# Patient Record
Sex: Male | Born: 1980 | Race: White | Hispanic: No | Smoking: Never smoker
Health system: Southern US, Community
[De-identification: ages and names within clinical notes are randomized; demographics above are authoritative.]

## PROBLEM LIST (undated history)

## (undated) DIAGNOSIS — F419 Anxiety disorder, unspecified: Secondary | ICD-10-CM

## (undated) DIAGNOSIS — F909 Attention-deficit hyperactivity disorder, unspecified type: Secondary | ICD-10-CM

## (undated) DIAGNOSIS — U071 COVID-19: Secondary | ICD-10-CM

## (undated) HISTORY — DX: Attention-deficit hyperactivity disorder, unspecified type: F90.9

## (undated) HISTORY — DX: COVID-19: U07.1

---

## 1999-12-18 ENCOUNTER — Encounter: Admission: RE | Admit: 1999-12-18 | Discharge: 1999-12-18 | Payer: Self-pay

## 2000-01-04 ENCOUNTER — Ambulatory Visit (HOSPITAL_BASED_OUTPATIENT_CLINIC_OR_DEPARTMENT_OTHER): Admission: RE | Admit: 2000-01-04 | Discharge: 2000-01-04 | Payer: Self-pay | Admitting: Oral Surgery

## 2007-07-07 ENCOUNTER — Emergency Department (HOSPITAL_COMMUNITY): Admission: EM | Admit: 2007-07-07 | Discharge: 2007-07-08 | Payer: Self-pay | Admitting: Emergency Medicine

## 2010-06-16 NOTE — Op Note (Signed)
[place]. [facility]  Patient:    Cory Bell, Cory Bell                   MRN: [id number] Proc. Date: 01/04/00 Adm. Date:  [id number] Attending:  Retia Passe                           Operative Report  PREOPERATIVE DIAGNOSIS:  Maxillary and mandibular impacted third molar teeth numbers 1, 16, 17 and 32; history of hypoglycemia reactions.  POSTOPERATIVE DIAGNOSIS:  Maxillary and mandibular impacted third molar teeth numbers 1, 16, 17 and 32, history of hypoglycemia reactions.  OPERATION PERFORMED:  Removal of impacted third molar teeth, numbers 1, 16, 17, and 32.  SURGEON:  Vania Rea. Warren Danes, D.D.S.  ASSISTANT:  Gaynell Face.  ANESTHESIA:  General via oral endotracheal intubation.  ESTIMATED BLOOD LOSS:  Less than 50 cc.  FLUID REPLACEMENT:  Approximately 1000 cc crystalloid solutions.  COMPLICATIONS:  None apparent.  INDICATIONS FOR PROCEDURE:  The patient is a 30 year old gentleman who was referred to my office for removal of his third molar teeth.  He presented with a severe history of chronic hypoglycemic attacks and even in the office before even being seated while still in the waiting room, suffered a severe hypoglycemic attack.  Due to his medical history, the patients third molar teeth were scheduled to be removed in an operating room setting where the glucose could be maintained properly.  DESCRIPTION OF PROCEDURE:  On January 04, 2000, Cory Bell was taken to the [facility] where he was placed on the operating room table in a supine position.  Following successful oral endotracheal intubation and general anesthesia, the patients face, neck and oral cavity were prepped and draped in the usual sterile operating room fashion.  The hypopharynx was suctioned free of fluids and secretions and a moistened 2 inch vaginal pack was placed as a throat pack.  Attention was then directed intraorally, where approximately 10  cc of 0.5% Xylocaine with 1:200,000 epinephrine were infiltrated in the right and left posterior superior alveolar neurovascular regions, the corresponding palatal soft tissues, and the right and left inferior alveolar neurovascular regions. A #15 Bard Parker blade was then used to create a full thickness mucoperiosteal incision about teeth numbers 1, 16, 17 and 32.  A full thickness mucoperiosteal flap was elevated laterally using a #9 Molt periosteal elevator.  The embedded teeth were then subluxated from the alveolus and removed from the maxillary arch using rongeurs forceps.  The mandibular third molar teeth were sectioned in their long axes using the Stryker rotary osteotome and a straight fissure burr.  The teeth were then elevated from the alveolus using an 11-A elevator and removed using rongeurs and forceps.  The surrounding dental follicular tissue was curetted and removed using a double-ended Molt curet.  The bony margins were smoothed and contoured with a small osseous file and surgical sites copiously irrigated with sterile saline irrigating solutions and suctioned.  Mucoperiosteal margins were approximated and sutured in an interrupted fashion using 4-0 chromic suture material on a PS-2 needle. The throat pack was removed and the hypopharynx suctioned free of fluids and secretions.  The patient was allowed to awaken from the anesthesia and taken to the recovery room where he tolerated the procedure well and without apparent complication. DD:  01/04/00 TD:  01/04/00 Job: [id number]

## 2010-10-26 LAB — DIFFERENTIAL
Basophils Relative: 0
Lymphocytes Relative: 11 — ABNORMAL LOW
Lymphs Abs: 1.4
Monocytes Absolute: 1.3 — ABNORMAL HIGH
Monocytes Relative: 10
Neutro Abs: 10.3 — ABNORMAL HIGH
Neutrophils Relative %: 79 — ABNORMAL HIGH

## 2010-10-26 LAB — CBC
Hemoglobin: 16.2
MCHC: 34
RBC: 5.32
WBC: 13.1 — ABNORMAL HIGH

## 2010-10-26 LAB — POCT I-STAT, CHEM 8
BUN: 15
Calcium, Ion: 1.13
Chloride: 104
Glucose, Bld: 119 — ABNORMAL HIGH
HCT: 50
Potassium: 3.3 — ABNORMAL LOW

## 2012-02-14 ENCOUNTER — Ambulatory Visit (INDEPENDENT_AMBULATORY_CARE_PROVIDER_SITE_OTHER): Payer: BC Managed Care – PPO | Admitting: Physician Assistant

## 2012-02-14 VITALS — BP 134/68 | HR 108 | Temp 99.4°F | Resp 18 | Ht 73.5 in | Wt 224.8 lb

## 2012-02-14 DIAGNOSIS — J101 Influenza due to other identified influenza virus with other respiratory manifestations: Secondary | ICD-10-CM

## 2012-02-14 DIAGNOSIS — R509 Fever, unspecified: Secondary | ICD-10-CM

## 2012-02-14 DIAGNOSIS — R05 Cough: Secondary | ICD-10-CM

## 2012-02-14 DIAGNOSIS — J111 Influenza due to unidentified influenza virus with other respiratory manifestations: Secondary | ICD-10-CM

## 2012-02-14 DIAGNOSIS — R059 Cough, unspecified: Secondary | ICD-10-CM

## 2012-02-14 LAB — POCT INFLUENZA A/B
Influenza A, POC: POSITIVE
Influenza B, POC: NEGATIVE

## 2012-02-14 MED ORDER — HYDROCOD POLST-CHLORPHEN POLST 10-8 MG/5ML PO LQCR: 5.0000 mL | Freq: Two times a day (BID) | ORAL | Status: DC | PRN

## 2012-02-14 MED ORDER — OSELTAMIVIR PHOSPHATE 75 MG PO CAPS: 75.0000 mg | ORAL_CAPSULE | Freq: Two times a day (BID) | ORAL | Status: DC

## 2012-02-14 NOTE — Progress Notes (Signed)
Patient ID: Cory Bell MRN: [id number], DOB: Oct 18, 1980, 32 y.o. Date of Encounter: 02/14/2012, 1:53 PM  Primary Physician: No primary provider on file.  Chief Complaint: fever, chills, nasal congestion  HPI: 32 y.o. year old male with presents with 2 day history of nasal congestion, fever, chills, and slight dry cough. Describes a burning chest pain associated with coughing.  No SOB, nausea, vomiting, or dizziness. Has tried Nyquil/Dayquil which has not helped much.  No known flu contacts, but does admit to several co-workers with similar illnesses.  Did not receive flu shot this year.    Patient is otherwise doing well without issues or complaints.  No past medical history on file.   Home Meds: Prior to Admission medications   Medication Sig Start Date End Date Taking? Authorizing Provider  Ascorbic Acid (VITAMIN C) 100 MG tablet Take 100 mg by mouth daily.   Yes Historical Provider, MD  Pseudoeph-Doxylamine-DM-APAP (NYQUIL PO) Take by mouth.   Yes Historical Provider, MD  Pseudoephedrine-APAP-DM (DAYQUIL PO) Take by mouth.   Yes Historical Provider, MD    Allergies:  Allergies  Allergen Reactions  . Septra (Sulfamethoxazole W-Trimethoprim) Rash    History   Social History  . Marital Status: Single    Spouse Name: N/A    Number of Children: N/A  . Years of Education: N/A   Occupational History  . Not on file.   Social History Main Topics  . Smoking status: Never Smoker   . Smokeless tobacco: Not on file  . Alcohol Use: 7.0 oz/week    14 drink(s) per week  . Drug Use: No  . Sexually Active: Not on file   Other Topics Concern  . Not on file   Social History Narrative  . No narrative on file     Review of Systems: Constitutional: positive for chills and fever. Negative for night sweats, weight changes, or fatigue  HEENT: negative for vision changes, hearing loss. Positive for congestion, rhinorrhea, ST. No epistaxis, or sinus pressure Cardiovascular:  negative for chest pain or palpitations Respiratory: positive for cough. negative for hemoptysis, wheezing, shortness of breath. Abdominal: negative for abdominal pain, nausea, vomiting, diarrhea, or constipation Dermatological: negative for rashes. Neurologic: negative for headache, dizziness, or syncope   Physical Exam: Blood pressure 134/68, pulse 108, temperature 99.4 F (37.4 C), temperature source Oral, resp. rate 18, height 6' 1.5" (1.867 m), weight 224 lb 12.8 oz (101.969 kg), SpO2 99.00%., Body mass index is 29.26 kg/(m^2). General: Well developed, well nourished, in no acute distress. Head: Normocephalic, atraumatic, eyes without discharge, sclera non-icteric, nares are without discharge. Bilateral auditory canals clear, TM's are without perforation, pearly grey and translucent with reflective cone of light bilaterally. Oral cavity moist, posterior pharynx without exudate, erythema, peritonsillar abscess, or post nasal drip.  Neck: Supple. No thyromegaly. Full ROM. No lymphadenopathy. Lungs: Clear bilaterally to auscultation without wheezes, rales, or rhonchi. Breathing is unlabored. Heart: RRR with S1 S2. No murmurs, rubs, or gallops appreciated. Msk:  Strength and tone normal for age. Extremities/Skin: Warm and dry. No clubbing or cyanosis. No edema.  Neuro: Alert and oriented X 3. Moves all extremities spontaneously. Gait is normal. CNII-XII grossly in tact. Psych:  Responds to questions appropriately with a normal affect.   Labs: Results for orders placed in visit on 02/14/12  POCT INFLUENZA A/B      Component Value Range   Influenza A, POC Positive     Influenza B, POC Negative  ASSESSMENT AND PLAN:  32 y.o. year old male with influenza Increase fluids and rest Ibuprofen and tylenol in alternating doses q4hours Start Tamiflu 75 mg bid x 5 days Tussionex q12hours prn cough Follow up if symptoms worsen or fail to improve.  Grier Mitts,  PA-C 02/14/2012 1:53 PM

## 2012-02-17 ENCOUNTER — Ambulatory Visit (INDEPENDENT_AMBULATORY_CARE_PROVIDER_SITE_OTHER): Payer: BC Managed Care – PPO | Admitting: Emergency Medicine

## 2012-02-17 ENCOUNTER — Encounter: Payer: Self-pay | Admitting: Family Medicine

## 2012-02-17 ENCOUNTER — Ambulatory Visit: Payer: BC Managed Care – PPO

## 2012-02-17 VITALS — BP 126/76 | HR 78 | Temp 98.7°F | Resp 16 | Ht 74.0 in | Wt 222.0 lb

## 2012-02-17 DIAGNOSIS — R059 Cough, unspecified: Secondary | ICD-10-CM

## 2012-02-17 DIAGNOSIS — R05 Cough: Secondary | ICD-10-CM

## 2012-02-17 DIAGNOSIS — J111 Influenza due to unidentified influenza virus with other respiratory manifestations: Secondary | ICD-10-CM

## 2012-02-17 DIAGNOSIS — R0602 Shortness of breath: Secondary | ICD-10-CM

## 2012-02-17 DIAGNOSIS — J40 Bronchitis, not specified as acute or chronic: Secondary | ICD-10-CM

## 2012-02-17 DIAGNOSIS — R042 Hemoptysis: Secondary | ICD-10-CM

## 2012-02-17 LAB — POCT CBC
HCT, POC: 49.5 % (ref 43.5–53.7)
Hemoglobin: 16.1 g/dL (ref 14.1–18.1)
Lymph, poc: 1.8 (ref 0.6–3.4)
MCH, POC: 30 pg (ref 27–31.2)
MCHC: 32.5 g/dL (ref 31.8–35.4)
MPV: 9.7 fL (ref 0–99.8)
POC Granulocyte: 4.5 (ref 2–6.9)
POC LYMPH PERCENT: 25.9 %L (ref 10–50)
POC MID %: 10 %M (ref 0–12)
RDW, POC: 12.2 %
WBC: 7 10*3/uL (ref 4.6–10.2)

## 2012-02-17 MED ORDER — DOXYCYCLINE HYCLATE 100 MG PO CAPS: 100.0000 mg | ORAL_CAPSULE | Freq: Two times a day (BID) | ORAL | Status: DC

## 2012-02-17 NOTE — Progress Notes (Signed)
  Subjective:    Patient ID: Cory Bell, male    DOB: 02/06/1980, 32 y.o.   MRN: [id number]  HPI 32 year old male presents with chest congestion, productive cough: coughing up brown "chunks"; fever, fatigue, wheezing at night  Diagnosed with the flu on 02/14/12.   Never smoker  Review of Systems     Objective:   Physical Exam HEENT exam. TMs are clear. Nose is congested. Posterior pharynx is clear. The neck is supple. Chest exam reveals a few rhonchi but no rales there are no areas of dullness.  [facility] reading (PRIMARY) by  Dr. Cleta Alberts there are mild increased interstitial markings present bilaterally.  Results for orders placed in visit on 02/17/12  POCT CBC      Component Value Range   WBC 7.0  4.6 - 10.2 K/uL   Lymph, poc 1.8  0.6 - 3.4   POC LYMPH PERCENT 25.9  10 - 50 %L   MID (cbc) 0.7  0 - 0.9   POC MID % 10.0  0 - 12 %M   POC Granulocyte 4.5  2 - 6.9   Granulocyte percent 64.1  37 - 80 %G   RBC 5.37  4.69 - 6.13 M/uL   Hemoglobin 16.1  14.1 - 18.1 g/dL   HCT, POC 40.9  81.1 - 53.7 %   MCV 92.2  80 - 97 fL   MCH, POC 30.0  27 - 31.2 pg   MCHC 32.5  31.8 - 35.4 g/dL   RDW, POC 91.4     Platelet Count, POC 211  142 - 424 K/uL   MPV 9.7  0 - 99.8 fL        Assessment & Plan:  White count is normal there are increased interstitial markings on chest x-ray will cover for secondary bronchitis infection

## 2013-03-24 ENCOUNTER — Encounter (HOSPITAL_COMMUNITY): Payer: Self-pay | Admitting: Emergency Medicine

## 2013-03-24 ENCOUNTER — Emergency Department (HOSPITAL_COMMUNITY): Payer: BC Managed Care – PPO

## 2013-03-24 ENCOUNTER — Emergency Department (HOSPITAL_COMMUNITY)
Admission: EM | Admit: 2013-03-24 | Discharge: 2013-03-24 | Disposition: A | Discharge status: Home / Self Care | Payer: BC Managed Care – PPO | Attending: Emergency Medicine | Admitting: Emergency Medicine

## 2013-03-24 DIAGNOSIS — Z79899 Other long term (current) drug therapy: Secondary | ICD-10-CM | POA: Insufficient documentation

## 2013-03-24 DIAGNOSIS — R5381 Other malaise: Secondary | ICD-10-CM | POA: Insufficient documentation

## 2013-03-24 DIAGNOSIS — Z8659 Personal history of other mental and behavioral disorders: Secondary | ICD-10-CM | POA: Insufficient documentation

## 2013-03-24 DIAGNOSIS — R079 Chest pain, unspecified: Secondary | ICD-10-CM

## 2013-03-24 DIAGNOSIS — R5383 Other fatigue: Secondary | ICD-10-CM

## 2013-03-24 DIAGNOSIS — R0602 Shortness of breath: Secondary | ICD-10-CM | POA: Insufficient documentation

## 2013-03-24 DIAGNOSIS — R002 Palpitations: Secondary | ICD-10-CM | POA: Insufficient documentation

## 2013-03-24 DIAGNOSIS — R109 Unspecified abdominal pain: Secondary | ICD-10-CM

## 2013-03-24 HISTORY — DX: Anxiety disorder, unspecified: F41.9

## 2013-03-24 LAB — I-STAT TROPONIN, ED
TROPONIN I, POC: 0.01 ng/mL (ref 0.00–0.08)
TROPONIN I, POC: 0 ng/mL (ref 0.00–0.08)

## 2013-03-24 LAB — CBC
HCT: 46.3 % (ref 39.0–52.0)
HEMOGLOBIN: 16.4 g/dL (ref 13.0–17.0)
MCH: 31.3 pg (ref 26.0–34.0)
MCHC: 35.4 g/dL (ref 30.0–36.0)
MCV: 88.4 fL (ref 78.0–100.0)
Platelets: 227 10*3/uL (ref 150–400)
RBC: 5.24 MIL/uL (ref 4.22–5.81)
RDW: 12.5 % (ref 11.5–15.5)
WBC: 6.5 10*3/uL (ref 4.0–10.5)

## 2013-03-24 LAB — BASIC METABOLIC PANEL
BUN: 11 mg/dL (ref 6–23)
CHLORIDE: 102 meq/L (ref 96–112)
CO2: 25 meq/L (ref 19–32)
Calcium: 9.6 mg/dL (ref 8.4–10.5)
Creatinine, Ser: 1.07 mg/dL (ref 0.50–1.35)
GFR calc Af Amer: >90 mL/min (ref >90)
GFR calc non Af Amer: 89 mL/min — ABNORMAL LOW (ref >90)
GLUCOSE: 107 mg/dL — AB (ref 70–99)
POTASSIUM: 3.9 meq/L (ref 3.7–5.3)
SODIUM: 143 meq/L (ref 137–147)

## 2013-03-24 LAB — HEPATIC FUNCTION PANEL
ALT: 65 U/L — ABNORMAL HIGH (ref 0–53)
AST: 32 U/L (ref 0–37)
Albumin: 4.4 g/dL (ref 3.5–5.2)
Alkaline Phosphatase: 58 U/L (ref 39–117)
TOTAL PROTEIN: 7.7 g/dL (ref 6.0–8.3)
Total Bilirubin: 0.6 mg/dL (ref 0.3–1.2)

## 2013-03-24 LAB — LIPASE, BLOOD: LIPASE: 33 U/L (ref 11–59)

## 2013-03-24 LAB — CBG MONITORING, ED: Glucose-Capillary: 109 mg/dL — ABNORMAL HIGH (ref 70–99)

## 2013-03-24 LAB — PRO B NATRIURETIC PEPTIDE: Pro B Natriuretic peptide (BNP): 31.1 pg/mL (ref 0–125)

## 2013-03-24 MED ORDER — HEPARIN (PORCINE) IN NACL 100-0.45 UNIT/ML-% IJ SOLN
1000.0000 [IU]/h | INTRAMUSCULAR | Status: DC
  Filled 2013-03-24: qty 250

## 2013-03-24 MED ORDER — GI COCKTAIL ~~LOC~~
30.0000 mL | Freq: Once | ORAL | Status: AC
  Administered 2013-03-24: 30 mL via ORAL
  Filled 2013-03-24: qty 30

## 2013-03-24 MED ORDER — OMEPRAZOLE 20 MG PO CPDR: 20.0000 mg | DELAYED_RELEASE_CAPSULE | Freq: Every day | ORAL | Status: DC

## 2013-03-24 MED ORDER — IBUPROFEN 200 MG PO TABS
600.0000 mg | ORAL_TABLET | Freq: Once | ORAL | Status: AC
  Administered 2013-03-24: 600 mg via ORAL
  Filled 2013-03-24: qty 3

## 2013-03-24 NOTE — ED Provider Notes (Signed)
CSN: [id number]     Arrival date & time 03/24/13  1611 History   First MD Initiated Contact with Patient 03/24/13 1955     Chief Complaint  Patient presents with  . Chest Pain  . Shortness of Breath  . Palpitations  . Fatigue     (Consider location/radiation/quality/duration/timing/severity/associated sxs/prior Treatment) HPI  This a 58 are old male with history of anxiety who presents with chest pain, shortness of breath, palpitations, and abdominal pain. Patient reports intermittent chest pain over the last 2 days. He states that it comes and goes. It is not exertional. It does get worse with movement. He is not having any pain right now. Last episode was approximately 3 hours ago. He reports associated shortness of breath. Patient also reports that he on occasion gets "bloating" after he eats.  He denies any abdominal pain right now.  He denies any history of hypertension, hypercholesterolemia, smoking. He denies any early family of heart disease. He denies any recent hospitalization, long travel, history of blood clots. He denies any unilateral leg swelling. He denies any cough, fevers.  Past Medical History  Diagnosis Date  . Anxiety    History reviewed. No pertinent past surgical history. No family history on file. History  Substance Use Topics  . Smoking status: Never Smoker   . Smokeless tobacco: Not on file  . Alcohol Use: 7.0 oz/week    14 drink(s) per week    Review of Systems  Constitutional: Negative.  Negative for fever.  Respiratory: Positive for chest tightness and shortness of breath. Negative for cough.   Cardiovascular: Negative.  Negative for chest pain.  Gastrointestinal: Positive for abdominal pain. Negative for nausea, vomiting and diarrhea.  Genitourinary: Negative.  Negative for dysuria.  Musculoskeletal: Negative for back pain.  Skin: Negative for rash.  Neurological: Negative for headaches.  All other systems reviewed and are  negative.      Allergies  Septra  Home Medications   Current Outpatient Rx  Name  Route  Sig  Dispense  Refill  . omeprazole (PRILOSEC) 20 MG capsule   Oral   Take 1 capsule (20 mg total) by mouth daily.   30 capsule   0    BP 128/72  Pulse 67  Temp(Src) 97.6 F (36.4 C) (Oral)  Resp 17  Wt 236 lb (107.049 kg)  SpO2 95% Physical Exam  Nursing note and vitals reviewed. Constitutional: He is oriented to person, place, and time. He appears well-developed and well-nourished.  HENT:  Head: Normocephalic and atraumatic.  Eyes: Pupils are equal, round, and reactive to light.  Neck: Neck supple.  Cardiovascular: Normal rate, regular rhythm and normal heart sounds.   No murmur heard. Pulmonary/Chest: Effort normal and breath sounds normal. No respiratory distress. He has no wheezes.  Abdominal: Soft. Bowel sounds are normal. There is no tenderness. There is no rebound.  Musculoskeletal: He exhibits no edema.  Lymphadenopathy:    He has no cervical adenopathy.  Neurological: He is alert and oriented to person, place, and time.  Skin: Skin is warm and dry.  Psychiatric: He has a normal mood and affect.    ED Course  Procedures (including critical care time) Labs Review Labs Reviewed  BASIC METABOLIC PANEL - Abnormal; Notable for the following:    Glucose, Bld 107 (*)    GFR calc non Af Amer 89 (*)    All other components within normal limits  HEPATIC FUNCTION PANEL - Abnormal; Notable for the following:  ALT 65 (*)    All other components within normal limits  CBG MONITORING, ED - Abnormal; Notable for the following:    Glucose-Capillary 109 (*)    All other components within normal limits  CBC  PRO B NATRIURETIC PEPTIDE  LIPASE, BLOOD  I-STAT TROPOININ, ED  I-STAT TROPOININ, ED   Imaging Review Dg Chest 2 View  03/24/2013   CLINICAL DATA:  Chest tightness, shortness of breath, palpitations and fatigue.  EXAM: CHEST  2 VIEW  COMPARISON:  02/17/2012.   FINDINGS: The heart remains normal in size. The lungs remain clear with normal vascularity. Normal appearing bones.  IMPRESSION: Normal examination, unchanged.   Electronically Signed   By: Gordan PaymentSteve  Reid M.D.   On: 03/24/2013 18:06    EKG Interpretation    Date/Time:  Tuesday March 24 2013 16:14:05 EST Ventricular Rate:  97 PR Interval:  126 QRS Duration: 86 QT Interval:  324 QTC Calculation: 411 R Axis:   46 Text Interpretation:  Normal sinus rhythm Normal ECG Confirmed by Mileah Hemmer  MD, Nayomi Tabron ([id number]) on 03/24/2013 7:55:56 PM            MDM   Final diagnoses:  Chest pain  Abdominal pain   Patient presents with multiple complaints. He is nontoxic-appearing on exam and vital signs are within normal limits. He is low risk for ACS. EKG is within normal limits. Delta troponin is negative and chest x-ray shows no evidence of pneumothorax or pneumonia. Patient PERC negative.  Has not had recurrent pain here.  I have obtained LFTs and lipase to investigate patient's occasional abdominal pain. Suspect occurred given that happens after eating. Patient was given GI cocktail. Workup is reassuring. He does not have a primary care physician and will be referred. I have discussed with him that a primary doctor can refer him for further GI evaluation if he continues to have pain. He may need a gallbladder evaluation. Patient stated understanding.  After history, exam, and medical workup I feel the patient has been appropriately medically screened and is safe for discharge home. Pertinent diagnoses were discussed with the patient. Patient was given return precautions.     Shon Batonourtney F Rea Reser, MD 03/26/13 [phone]

## 2013-03-24 NOTE — ED Notes (Signed)
Pt is here with chest pain, sob, palpitations, and feels like abdomen is swollen.  Pt states when he lays down over the last 2 days he feels like he is sufficating.  Pt states he suffers from anxiety but these symptoms are different

## 2013-03-24 NOTE — Discharge Instructions (Signed)
Chest Pain (Nonspecific) °It is often hard to give a specific diagnosis for the cause of chest pain. There is always a chance that your pain could be related to something serious, such as a heart attack or a blood clot in the lungs. You need to follow up with your caregiver for further evaluation. °CAUSES  °· Heartburn. °· Pneumonia or bronchitis. °· Anxiety or stress. °· Inflammation around your heart (pericarditis) or lung (pleuritis or pleurisy). °· A blood clot in the lung. °· A collapsed lung (pneumothorax). It can develop suddenly on its own (spontaneous pneumothorax) or from injury (trauma) to the chest. °· Shingles infection (herpes zoster virus). °The chest wall is composed of bones, muscles, and cartilage. Any of these can be the source of the pain. °· The bones can be bruised by injury. °· The muscles or cartilage can be strained by coughing or overwork. °· The cartilage can be affected by inflammation and become sore (costochondritis). °DIAGNOSIS  °Lab tests or other studies, such as X-rays, electrocardiography, stress testing, or cardiac imaging, may be needed to find the cause of your pain.  °TREATMENT  °· Treatment depends on what may be causing your chest pain. Treatment may include: °· Acid blockers for heartburn. °· Anti-inflammatory medicine. °· Pain medicine for inflammatory conditions. °· Antibiotics if an infection is present. °· You may be advised to change lifestyle habits. This includes stopping smoking and avoiding alcohol, caffeine, and chocolate. °· You may be advised to keep your head raised (elevated) when sleeping. This reduces the chance of acid going backward from your stomach into your esophagus. °· Most of the time, nonspecific chest pain will improve within 2 to 3 days with rest and mild pain medicine. °HOME CARE INSTRUCTIONS  °· If antibiotics were prescribed, take your antibiotics as directed. Finish them even if you start to feel better. °· For the next few days, avoid physical  activities that bring on chest pain. Continue physical activities as directed. °· Do not smoke. °· Avoid drinking alcohol. °· Only take over-the-counter or prescription medicine for pain, discomfort, or fever as directed by your caregiver. °· Follow your caregiver's suggestions for further testing if your chest pain does not go away. °· Keep any follow-up appointments you made. If you do not go to an appointment, you could develop lasting (chronic) problems with pain. If there is any problem keeping an appointment, you must call to reschedule. °SEEK MEDICAL CARE IF:  °· You think you are having problems from the medicine you are taking. Read your medicine instructions carefully. °· Your chest pain does not go away, even after treatment. °· You develop a rash with blisters on your chest. °SEEK IMMEDIATE MEDICAL CARE IF:  °· You have increased chest pain or pain that spreads to your arm, neck, jaw, back, or abdomen. °· You develop shortness of breath, an increasing cough, or you are coughing up blood. °· You have severe back or abdominal pain, feel nauseous, or vomit. °· You develop severe weakness, fainting, or chills. °· You have a fever. °THIS IS AN EMERGENCY. Do not wait to see if the pain will go away. Get medical help at once. Call your local emergency services (911 in [place] Do not drive yourself to the hospital. °MAKE SURE YOU:  °· Understand these instructions. °· Will watch your condition. °· Will get help right away if you are not doing well or get worse. °Document Released: 10/25/2004 Document Revised: 04/09/2011 Document Reviewed: 08/21/2007 °ExitCare® Patient Information ©2014 [employer]   LL[employer]. °Gastroesophageal Reflux Disease, Adult °Gastroesophageal reflux disease (GERD) happens when acid from your stomach flows up into the esophagus. When acid comes in contact with the esophagus, the acid causes soreness (inflammation) in the esophagus. Over time, GERD may create small holes (ulcers) in the lining of the  esophagus. °CAUSES  °· Increased body weight. This puts pressure on the stomach, making acid rise from the stomach into the esophagus. °· Smoking. This increases acid production in the stomach. °· Drinking alcohol. This causes decreased pressure in the lower esophageal sphincter (valve or ring of muscle between the esophagus and stomach), allowing acid from the stomach into the esophagus. °· Late evening meals and a full stomach. This increases pressure and acid production in the stomach. °· A malformed lower esophageal sphincter. °Sometimes, no cause is found. °SYMPTOMS  °· Burning pain in the lower part of the mid-chest behind the breastbone and in the mid-stomach area. This may occur twice a week or more often. °· Trouble swallowing. °· Sore throat. °· Dry cough. °· Asthma-like symptoms including chest tightness, shortness of breath, or wheezing. °DIAGNOSIS  °Your caregiver may be able to diagnose GERD based on your symptoms. In some cases, X-rays and other tests may be done to check for complications or to check the condition of your stomach and esophagus. °TREATMENT  °Your caregiver may recommend over-the-counter or prescription medicines to help decrease acid production. Ask your caregiver before starting or adding any new medicines.  °HOME CARE INSTRUCTIONS  °· Change the factors that you can control. Ask your caregiver for guidance concerning weight loss, quitting smoking, and alcohol consumption. °· Avoid foods and drinks that make your symptoms worse, such as: °· Caffeine or alcoholic drinks. °· Chocolate. °· Peppermint or mint flavorings. °· Garlic and onions. °· Spicy foods. °· Citrus fruits, such as oranges, lemons, or limes. °· Tomato-based foods such as sauce, chili, salsa, and pizza. °· Fried and fatty foods. °· Avoid lying down for the 3 hours prior to your bedtime or prior to taking a nap. °· Eat small, frequent meals instead of large meals. °· Wear loose-fitting clothing. Do not wear anything  tight around your waist that causes pressure on your stomach. °· Raise the head of your bed 6 to 8 inches with wood blocks to help you sleep. Extra pillows will not help. °· Only take over-the-counter or prescription medicines for pain, discomfort, or fever as directed by your caregiver. °· Do not take aspirin, ibuprofen, or other nonsteroidal anti-inflammatory drugs (NSAIDs). °SEEK IMMEDIATE MEDICAL CARE IF:  °· You have pain in your arms, neck, jaw, teeth, or back. °· Your pain increases or changes in intensity or duration. °· You develop nausea, vomiting, or sweating (diaphoresis). °· You develop shortness of breath, or you faint. °· Your vomit is green, yellow, black, or looks like coffee grounds or blood. °· Your stool is red, bloody, or black. °These symptoms could be signs of other problems, such as heart disease, gastric bleeding, or esophageal bleeding. °MAKE SURE YOU:  °· Understand these instructions. °· Will watch your condition. °· Will get help right away if you are not doing well or get worse. °Document Released: 10/25/2004 Document Revised: 04/09/2011 Document Reviewed: 08/04/2010 °ExitCare® Patient Information ©2014 [employer]. ° °

## 2013-03-24 NOTE — ED Notes (Signed)
Dr. Horton at bedside. 

## 2014-09-28 ENCOUNTER — Ambulatory Visit (INDEPENDENT_AMBULATORY_CARE_PROVIDER_SITE_OTHER): Payer: Worker's Compensation | Admitting: Family Medicine

## 2014-09-28 ENCOUNTER — Ambulatory Visit: Payer: Worker's Compensation

## 2014-09-28 VITALS — BP 124/80 | HR 71 | Temp 98.7°F | Resp 17 | Ht 72.0 in | Wt 223.0 lb

## 2014-09-28 DIAGNOSIS — S6992XA Unspecified injury of left wrist, hand and finger(s), initial encounter: Secondary | ICD-10-CM

## 2014-09-28 DIAGNOSIS — S61012S Laceration without foreign body of left thumb without damage to nail, sequela: Secondary | ICD-10-CM | POA: Diagnosis not present

## 2014-09-28 DIAGNOSIS — Z23 Encounter for immunization: Secondary | ICD-10-CM

## 2014-09-28 DIAGNOSIS — R55 Syncope and collapse: Secondary | ICD-10-CM | POA: Diagnosis not present

## 2014-09-28 MED ORDER — NAPROXEN 500 MG PO TABS: 500.0000 mg | ORAL_TABLET | Freq: Two times a day (BID) | ORAL | Status: DC

## 2014-09-28 MED ORDER — AMOXICILLIN-POT CLAVULANATE 875-125 MG PO TABS: 1.0000 | ORAL_TABLET | Freq: Two times a day (BID) | ORAL | Status: DC

## 2014-09-28 NOTE — Progress Notes (Signed)
Cory Bell [birth year] 34 y.o.   Chief Complaint  Patient presents with  . Hand Injury    cut to hand     Date of Injury: 09/28/2014   Presents for evaluation of work-related complaint.  History of Present Illness: Patient sustained a laceration the base of the left thumb while working to install a stove today and reports his left hand got caught in the door.  States while trying to free his hand a metal pin in the door became stuck in his hand.  He complains of tenderness of the first left metacarpal just proximal to the laceration. Denies changes in sensation distal to the injury.  Denies a change in function of the hand.     Reports that after the incident he passed out at the sight of his own blood and says that this has happened before.  Denies a history of seizures and feels well now.     Review of Systems  Constitutional: Negative for fever.  Cardiovascular: Negative for chest pain.  Musculoskeletal: Positive for joint pain. Negative for neck pain.  Skin: Negative for rash.  Neurological: Negative for dizziness.      Allergies  Allergen Reactions  . Septra [Sulfamethoxazole-Trimethoprim] Rash     Current medications reviewed and updated. Past medical history, family history, social history have been reviewed and updated.  Filed Vitals:   09/28/14 1326  BP: 124/80  Pulse: 71  Temp: 98.7 F (37.1 C)  Resp: 17     Physical Exam  Constitutional: He is oriented to person, place, and time and well-developed, well-nourished, and in no distress. No distress.  Cardiovascular: Normal rate.   Pulmonary/Chest: Effort normal.  Abdominal: He exhibits no distension.  Musculoskeletal:       Right hand: Normal.       Left hand: He exhibits tenderness and bony tenderness. He exhibits normal range of motion and normal capillary refill. Normal sensation noted. Normal strength noted.       Hands: Neurological: He is alert and oriented to person, place, and time. He  has normal sensation. He displays no weakness, facial symmetry, normal stance and normal speech. No cranial nerve deficit. He exhibits normal muscle tone. He has a normal Cerebellar Exam, a normal Romberg Test and a normal Tandem Gait Test. He shows no pronator drift. Gait normal. Coordination and gait normal. GCS score is 15.  Skin: Skin is warm and dry. He is not diaphoretic.  Psychiatric: Affect normal.   [facility] reading (PRIMARY) by  Dr. Alwyn Ren: Negative.    Procedure: Risk and benefits discussed and verbal consent obtained. The patient was anesthetized using 1 cc of 2% lidocaine. The wound was scrubbed with soap and water. Sterile prep and drape. The wound was closed with 4-0 Prolene.  The wound was then cleaned with water and a bandage was applied. Patient instructed to come back for suture removal in 10 days.    Assessment and Plan:  [name] was seen today for hand injury.  Diagnoses and all orders for this visit:  Hand injury, left, initial encounter: Will treat pain with Naprosyn.  Given this injury is so close the base of the 1st will cover with broad spectrum antibiotic.  He is to follow up in ten days for suture removal.   -     DG Hand Complete Left; Future  Vasovagal syncope: Patient with intact neurological exam.

## 2014-09-28 NOTE — Progress Notes (Deleted)
   09/28/2014 at 1:43 PM  Cory Bell / DOB: 1980-05-10 / MRN: [id number]  The patient  does not have a problem list on file.  SUBJECTIVE  Cory Bell is a 34 y.o. {DESC; WELL/ILL:30681} appearing male presenting for the chief complaint of ***.     He  has a past medical history of Anxiety.    Medications reviewed and updated by myself where necessary, and exist elsewhere in the encounter.   Mr. Barthelemy is allergic to septra. He  reports that he has never smoked. He does not have any smokeless tobacco history on file. He reports that he drinks about 7.0 oz of alcohol per week. He reports that he does not use illicit drugs. He  has no sexual activity history on file. The patient  has no past surgical history on file.  His family history is not on file.  ROS  OBJECTIVE  His  height is 6' (1.829 m) and weight is 223 lb (101.152 kg). His oral temperature is 98.7 F (37.1 C). His blood pressure is 124/80 and his pulse is 71. His respiration is 17 and oxygen saturation is 98%.  The patient's body mass index is 30.24 kg/(m^2).  Physical Exam  No results found for this or any previous visit (from the past 24 hour(s)).  ASSESSMENT & PLAN  Anias was seen today for hand injury.  Diagnoses and all orders for this visit:  Hand injury, left, initial encounter -     DG Hand Complete Left; Future  Vasovagal syncope    The patient was advised to call or come back to clinic if he does not see an improvement in symptoms, or worsens with the above plan.   Deliah Boston, MHS, PA-C [facility] 09/28/2014 1:43 PM

## 2014-09-29 NOTE — Progress Notes (Signed)
  Subjective:  Patient ID: Cory Bell, male    DOB: [birth year]  Age: 34 y.o. MRN: [id number]  Patient caught his hand in an open door on a projection that could not just pull it free. It's stuck in at the base of his right thumb. He was seen by the PA evaluated.     Objective:   I examined the patient also because I need to know exactly. On the x-ray.  X-ray was negative  Assessment & Plan:   Assessment:  Laceration and puncture wound of the  Plan:  Her PA instructions. See Deliah Boston PA procedure note    Raiven Belizaire, MD 09/29/2014

## 2015-08-08 DIAGNOSIS — E663 Overweight: Secondary | ICD-10-CM | POA: Diagnosis not present

## 2015-08-08 DIAGNOSIS — Z6829 Body mass index (BMI) 29.0-29.9, adult: Secondary | ICD-10-CM | POA: Diagnosis not present

## 2015-08-08 DIAGNOSIS — R41840 Attention and concentration deficit: Secondary | ICD-10-CM | POA: Diagnosis not present

## 2015-08-30 DIAGNOSIS — F909 Attention-deficit hyperactivity disorder, unspecified type: Secondary | ICD-10-CM | POA: Diagnosis not present

## 2015-10-18 DIAGNOSIS — F909 Attention-deficit hyperactivity disorder, unspecified type: Secondary | ICD-10-CM | POA: Diagnosis not present

## 2015-11-28 DIAGNOSIS — F908 Attention-deficit hyperactivity disorder, other type: Secondary | ICD-10-CM | POA: Diagnosis not present

## 2015-11-28 DIAGNOSIS — Z6830 Body mass index (BMI) 30.0-30.9, adult: Secondary | ICD-10-CM | POA: Diagnosis not present

## 2015-11-28 DIAGNOSIS — Z1389 Encounter for screening for other disorder: Secondary | ICD-10-CM | POA: Diagnosis not present

## 2015-11-28 DIAGNOSIS — Z79899 Other long term (current) drug therapy: Secondary | ICD-10-CM | POA: Diagnosis not present

## 2015-12-29 DIAGNOSIS — Z6829 Body mass index (BMI) 29.0-29.9, adult: Secondary | ICD-10-CM | POA: Diagnosis not present

## 2015-12-29 DIAGNOSIS — F908 Attention-deficit hyperactivity disorder, other type: Secondary | ICD-10-CM | POA: Diagnosis not present

## 2016-02-02 DIAGNOSIS — Z6830 Body mass index (BMI) 30.0-30.9, adult: Secondary | ICD-10-CM | POA: Diagnosis not present

## 2016-02-02 DIAGNOSIS — F908 Attention-deficit hyperactivity disorder, other type: Secondary | ICD-10-CM | POA: Diagnosis not present

## 2016-05-17 DIAGNOSIS — F908 Attention-deficit hyperactivity disorder, other type: Secondary | ICD-10-CM | POA: Diagnosis not present

## 2016-05-17 DIAGNOSIS — Z6830 Body mass index (BMI) 30.0-30.9, adult: Secondary | ICD-10-CM | POA: Diagnosis not present

## 2016-07-25 DIAGNOSIS — F4322 Adjustment disorder with anxiety: Secondary | ICD-10-CM | POA: Diagnosis not present

## 2016-09-18 DIAGNOSIS — F4322 Adjustment disorder with anxiety: Secondary | ICD-10-CM | POA: Diagnosis not present

## 2016-10-10 DIAGNOSIS — F4320 Adjustment disorder, unspecified: Secondary | ICD-10-CM | POA: Diagnosis not present

## 2016-10-18 DIAGNOSIS — Z6829 Body mass index (BMI) 29.0-29.9, adult: Secondary | ICD-10-CM | POA: Diagnosis not present

## 2016-10-18 DIAGNOSIS — W57XXXA Bitten or stung by nonvenomous insect and other nonvenomous arthropods, initial encounter: Secondary | ICD-10-CM | POA: Diagnosis not present

## 2016-10-18 DIAGNOSIS — R51 Headache: Secondary | ICD-10-CM | POA: Diagnosis not present

## 2016-10-18 DIAGNOSIS — M791 Myalgia: Secondary | ICD-10-CM | POA: Diagnosis not present

## 2016-10-24 DIAGNOSIS — Z6828 Body mass index (BMI) 28.0-28.9, adult: Secondary | ICD-10-CM | POA: Diagnosis not present

## 2016-10-24 DIAGNOSIS — F4322 Adjustment disorder with anxiety: Secondary | ICD-10-CM | POA: Diagnosis not present

## 2016-10-24 DIAGNOSIS — M542 Cervicalgia: Secondary | ICD-10-CM | POA: Diagnosis not present

## 2016-11-07 DIAGNOSIS — Z6829 Body mass index (BMI) 29.0-29.9, adult: Secondary | ICD-10-CM | POA: Diagnosis not present

## 2016-11-07 DIAGNOSIS — W57XXXA Bitten or stung by nonvenomous insect and other nonvenomous arthropods, initial encounter: Secondary | ICD-10-CM | POA: Diagnosis not present

## 2016-11-07 DIAGNOSIS — Z79899 Other long term (current) drug therapy: Secondary | ICD-10-CM | POA: Diagnosis not present

## 2016-11-07 DIAGNOSIS — F908 Attention-deficit hyperactivity disorder, other type: Secondary | ICD-10-CM | POA: Diagnosis not present

## 2016-11-07 DIAGNOSIS — F4320 Adjustment disorder, unspecified: Secondary | ICD-10-CM | POA: Diagnosis not present

## 2016-11-21 DIAGNOSIS — F4322 Adjustment disorder with anxiety: Secondary | ICD-10-CM | POA: Diagnosis not present

## 2016-11-28 DIAGNOSIS — F4322 Adjustment disorder with anxiety: Secondary | ICD-10-CM | POA: Diagnosis not present

## 2016-12-18 DIAGNOSIS — F4322 Adjustment disorder with anxiety: Secondary | ICD-10-CM | POA: Diagnosis not present

## 2016-12-21 DIAGNOSIS — F4322 Adjustment disorder with anxiety: Secondary | ICD-10-CM | POA: Diagnosis not present

## 2017-01-24 DIAGNOSIS — F4322 Adjustment disorder with anxiety: Secondary | ICD-10-CM | POA: Diagnosis not present

## 2017-02-06 DIAGNOSIS — F4322 Adjustment disorder with anxiety: Secondary | ICD-10-CM | POA: Diagnosis not present

## 2017-02-23 DIAGNOSIS — F4322 Adjustment disorder with anxiety: Secondary | ICD-10-CM | POA: Diagnosis not present

## 2017-03-09 DIAGNOSIS — F4322 Adjustment disorder with anxiety: Secondary | ICD-10-CM | POA: Diagnosis not present

## 2017-03-13 DIAGNOSIS — F4320 Adjustment disorder, unspecified: Secondary | ICD-10-CM | POA: Diagnosis not present

## 2017-03-27 DIAGNOSIS — F4320 Adjustment disorder, unspecified: Secondary | ICD-10-CM | POA: Diagnosis not present

## 2017-05-08 DIAGNOSIS — F908 Attention-deficit hyperactivity disorder, other type: Secondary | ICD-10-CM | POA: Diagnosis not present

## 2017-05-08 DIAGNOSIS — Z79899 Other long term (current) drug therapy: Secondary | ICD-10-CM | POA: Diagnosis not present

## 2017-05-08 DIAGNOSIS — A09 Infectious gastroenteritis and colitis, unspecified: Secondary | ICD-10-CM | POA: Diagnosis not present

## 2017-05-08 DIAGNOSIS — Z6829 Body mass index (BMI) 29.0-29.9, adult: Secondary | ICD-10-CM | POA: Diagnosis not present

## 2017-07-09 DIAGNOSIS — Z131 Encounter for screening for diabetes mellitus: Secondary | ICD-10-CM | POA: Diagnosis not present

## 2017-07-09 DIAGNOSIS — Z0001 Encounter for general adult medical examination with abnormal findings: Secondary | ICD-10-CM | POA: Diagnosis not present

## 2017-07-11 DIAGNOSIS — F908 Attention-deficit hyperactivity disorder, other type: Secondary | ICD-10-CM | POA: Diagnosis not present

## 2017-07-11 DIAGNOSIS — Z6830 Body mass index (BMI) 30.0-30.9, adult: Secondary | ICD-10-CM | POA: Diagnosis not present

## 2017-07-11 DIAGNOSIS — Z0001 Encounter for general adult medical examination with abnormal findings: Secondary | ICD-10-CM | POA: Diagnosis not present

## 2017-10-15 DIAGNOSIS — Z6830 Body mass index (BMI) 30.0-30.9, adult: Secondary | ICD-10-CM | POA: Diagnosis not present

## 2017-10-15 DIAGNOSIS — F908 Attention-deficit hyperactivity disorder, other type: Secondary | ICD-10-CM | POA: Diagnosis not present

## 2017-10-15 DIAGNOSIS — Z1331 Encounter for screening for depression: Secondary | ICD-10-CM | POA: Diagnosis not present

## 2017-10-15 DIAGNOSIS — Z79899 Other long term (current) drug therapy: Secondary | ICD-10-CM | POA: Diagnosis not present

## 2017-10-23 DIAGNOSIS — J343 Hypertrophy of nasal turbinates: Secondary | ICD-10-CM | POA: Diagnosis not present

## 2017-10-23 DIAGNOSIS — J342 Deviated nasal septum: Secondary | ICD-10-CM | POA: Diagnosis not present

## 2018-01-09 DIAGNOSIS — Z6829 Body mass index (BMI) 29.0-29.9, adult: Secondary | ICD-10-CM | POA: Diagnosis not present

## 2018-01-09 DIAGNOSIS — J029 Acute pharyngitis, unspecified: Secondary | ICD-10-CM | POA: Diagnosis not present

## 2018-01-09 DIAGNOSIS — J358 Other chronic diseases of tonsils and adenoids: Secondary | ICD-10-CM | POA: Diagnosis not present

## 2018-04-11 DIAGNOSIS — J069 Acute upper respiratory infection, unspecified: Secondary | ICD-10-CM | POA: Diagnosis not present

## 2018-04-11 DIAGNOSIS — Z6830 Body mass index (BMI) 30.0-30.9, adult: Secondary | ICD-10-CM | POA: Diagnosis not present

## 2018-04-11 DIAGNOSIS — F908 Attention-deficit hyperactivity disorder, other type: Secondary | ICD-10-CM | POA: Diagnosis not present

## 2018-09-10 DIAGNOSIS — M9902 Segmental and somatic dysfunction of thoracic region: Secondary | ICD-10-CM | POA: Diagnosis not present

## 2018-09-10 DIAGNOSIS — M50322 Other cervical disc degeneration at C5-C6 level: Secondary | ICD-10-CM | POA: Diagnosis not present

## 2018-09-10 DIAGNOSIS — M9901 Segmental and somatic dysfunction of cervical region: Secondary | ICD-10-CM | POA: Diagnosis not present

## 2018-09-10 DIAGNOSIS — G44219 Episodic tension-type headache, not intractable: Secondary | ICD-10-CM | POA: Diagnosis not present

## 2018-10-07 DIAGNOSIS — F908 Attention-deficit hyperactivity disorder, other type: Secondary | ICD-10-CM | POA: Diagnosis not present

## 2018-12-11 DIAGNOSIS — R400 Somnolence: Secondary | ICD-10-CM | POA: Diagnosis not present

## 2018-12-11 DIAGNOSIS — R0683 Snoring: Secondary | ICD-10-CM | POA: Diagnosis not present

## 2018-12-11 DIAGNOSIS — Z6830 Body mass index (BMI) 30.0-30.9, adult: Secondary | ICD-10-CM | POA: Diagnosis not present

## 2018-12-16 DIAGNOSIS — R0602 Shortness of breath: Secondary | ICD-10-CM | POA: Diagnosis not present

## 2018-12-16 DIAGNOSIS — G4733 Obstructive sleep apnea (adult) (pediatric): Secondary | ICD-10-CM | POA: Diagnosis not present

## 2018-12-17 DIAGNOSIS — R0602 Shortness of breath: Secondary | ICD-10-CM | POA: Diagnosis not present

## 2018-12-17 DIAGNOSIS — G4733 Obstructive sleep apnea (adult) (pediatric): Secondary | ICD-10-CM | POA: Diagnosis not present

## 2019-07-20 DIAGNOSIS — F908 Attention-deficit hyperactivity disorder, other type: Secondary | ICD-10-CM | POA: Diagnosis not present

## 2019-07-20 DIAGNOSIS — Z6830 Body mass index (BMI) 30.0-30.9, adult: Secondary | ICD-10-CM | POA: Diagnosis not present

## 2019-07-20 DIAGNOSIS — Z79899 Other long term (current) drug therapy: Secondary | ICD-10-CM | POA: Diagnosis not present

## 2020-01-20 DIAGNOSIS — U071 COVID-19: Secondary | ICD-10-CM | POA: Diagnosis not present

## 2020-01-20 DIAGNOSIS — Z7185 Encounter for immunization safety counseling: Secondary | ICD-10-CM | POA: Diagnosis not present

## 2020-01-20 DIAGNOSIS — R059 Cough, unspecified: Secondary | ICD-10-CM | POA: Diagnosis not present

## 2020-01-23 ENCOUNTER — Emergency Department (HOSPITAL_COMMUNITY)
Admission: EM | Admit: 2020-01-23 | Discharge: 2020-01-24 | Disposition: A | Discharge status: Home / Self Care | Payer: BC Managed Care – PPO | Attending: Emergency Medicine | Admitting: Emergency Medicine

## 2020-01-23 ENCOUNTER — Emergency Department (HOSPITAL_COMMUNITY): Payer: BC Managed Care – PPO

## 2020-01-23 ENCOUNTER — Encounter (HOSPITAL_COMMUNITY): Payer: Self-pay

## 2020-01-23 ENCOUNTER — Other Ambulatory Visit: Payer: Self-pay

## 2020-01-23 DIAGNOSIS — J189 Pneumonia, unspecified organism: Secondary | ICD-10-CM | POA: Diagnosis not present

## 2020-01-23 DIAGNOSIS — R0602 Shortness of breath: Secondary | ICD-10-CM | POA: Diagnosis not present

## 2020-01-23 DIAGNOSIS — U071 COVID-19: Secondary | ICD-10-CM | POA: Diagnosis not present

## 2020-01-23 DIAGNOSIS — R0789 Other chest pain: Secondary | ICD-10-CM | POA: Diagnosis not present

## 2020-01-23 DIAGNOSIS — R21 Rash and other nonspecific skin eruption: Secondary | ICD-10-CM | POA: Diagnosis not present

## 2020-01-23 DIAGNOSIS — I517 Cardiomegaly: Secondary | ICD-10-CM | POA: Diagnosis not present

## 2020-01-23 DIAGNOSIS — R079 Chest pain, unspecified: Secondary | ICD-10-CM | POA: Diagnosis not present

## 2020-01-23 LAB — COMPREHENSIVE METABOLIC PANEL
ALT: 51 U/L — ABNORMAL HIGH (ref 0–44)
AST: 43 U/L — ABNORMAL HIGH (ref 15–41)
Albumin: 3.6 g/dL (ref 3.5–5.0)
Alkaline Phosphatase: 52 U/L (ref 38–126)
Anion gap: 12 (ref 5–15)
BUN: 9 mg/dL (ref 6–20)
CO2: 24 mmol/L (ref 22–32)
Calcium: 8.8 mg/dL — ABNORMAL LOW (ref 8.9–10.3)
Chloride: 98 mmol/L (ref 98–111)
Creatinine, Ser: 1.31 mg/dL — ABNORMAL HIGH (ref 0.61–1.24)
GFR, Estimated: >60 mL/min (ref >60)
Glucose, Bld: 131 mg/dL — ABNORMAL HIGH (ref 70–99)
Potassium: 4.3 mmol/L (ref 3.5–5.1)
Sodium: 134 mmol/L — ABNORMAL LOW (ref 135–145)
Total Bilirubin: 0.9 mg/dL (ref 0.3–1.2)
Total Protein: 6.7 g/dL (ref 6.5–8.1)

## 2020-01-23 LAB — CBC WITH DIFFERENTIAL/PLATELET
Abs Immature Granulocytes: 0.02 10*3/uL (ref 0.00–0.07)
Basophils Absolute: 0 10*3/uL (ref 0.0–0.1)
Basophils Relative: 0 %
Eosinophils Absolute: 0 10*3/uL (ref 0.0–0.5)
Eosinophils Relative: 0 %
HCT: 49.5 % (ref 39.0–52.0)
Hemoglobin: 17 g/dL (ref 13.0–17.0)
Immature Granulocytes: 0 %
Lymphocytes Relative: 11 %
Lymphs Abs: 0.6 10*3/uL — ABNORMAL LOW (ref 0.7–4.0)
MCH: 30.6 pg (ref 26.0–34.0)
MCHC: 34.3 g/dL (ref 30.0–36.0)
MCV: 89 fL (ref 80.0–100.0)
Monocytes Absolute: 0.3 10*3/uL (ref 0.1–1.0)
Monocytes Relative: 6 %
Neutro Abs: 4.2 10*3/uL (ref 1.7–7.7)
Neutrophils Relative %: 83 %
Platelets: 113 10*3/uL — ABNORMAL LOW (ref 150–400)
RBC: 5.56 MIL/uL (ref 4.22–5.81)
RDW: 12.4 % (ref 11.5–15.5)
WBC: 5.1 10*3/uL (ref 4.0–10.5)
nRBC: 0 % (ref 0.0–0.2)

## 2020-01-23 LAB — URINALYSIS, ROUTINE W REFLEX MICROSCOPIC
Bilirubin Urine: NEGATIVE
Glucose, UA: NEGATIVE mg/dL
Hgb urine dipstick: NEGATIVE
Ketones, ur: 5 mg/dL — AB
Leukocytes,Ua: NEGATIVE
Nitrite: NEGATIVE
Protein, ur: NEGATIVE mg/dL
Specific Gravity, Urine: 1.012 (ref 1.005–1.030)
pH: 6 (ref 5.0–8.0)

## 2020-01-23 LAB — TROPONIN I (HIGH SENSITIVITY)
Troponin I (High Sensitivity): 7 ng/L (ref <18)
Troponin I (High Sensitivity): 8 ng/L (ref <18)

## 2020-01-23 LAB — LIPASE, BLOOD: Lipase: 34 U/L (ref 11–51)

## 2020-01-23 LAB — MAGNESIUM: Magnesium: 1.8 mg/dL (ref 1.7–2.4)

## 2020-01-23 MED ORDER — SODIUM CHLORIDE 0.9 % IV BOLUS
500.0000 mL | Freq: Once | INTRAVENOUS | Status: AC
  Administered 2020-01-23: 500 mL via INTRAVENOUS

## 2020-01-23 MED ORDER — IOHEXOL 350 MG/ML SOLN
60.0000 mL | Freq: Once | INTRAVENOUS | Status: AC | PRN
  Administered 2020-01-23: 60 mL via INTRAVENOUS

## 2020-01-23 MED ORDER — SODIUM CHLORIDE 0.9 % IV SOLN: 1200.0000 mg | Freq: Once | INTRAVENOUS | Status: DC

## 2020-01-23 MED ORDER — HYDROCOD POLST-CPM POLST ER 10-8 MG/5ML PO SUER: 5.0000 mL | Freq: Two times a day (BID) | ORAL | 0 refills | Status: DC | PRN

## 2020-01-23 MED ORDER — IBUPROFEN 400 MG PO TABS
600.0000 mg | ORAL_TABLET | Freq: Once | ORAL | Status: AC
  Administered 2020-01-23: 600 mg via ORAL
  Filled 2020-01-23: qty 1

## 2020-01-23 MED ORDER — EPINEPHRINE 0.3 MG/0.3ML IJ SOAJ: 0.3000 mg | Freq: Once | INTRAMUSCULAR | Status: DC | PRN

## 2020-01-23 MED ORDER — ALBUTEROL SULFATE HFA 108 (90 BASE) MCG/ACT IN AERS: 2.0000 | INHALATION_SPRAY | Freq: Once | RESPIRATORY_TRACT | Status: DC | PRN

## 2020-01-23 MED ORDER — SODIUM CHLORIDE 0.9 % IV SOLN
Freq: Once | INTRAVENOUS | Status: AC
  Filled 2020-01-23: qty 20

## 2020-01-23 MED ORDER — METHYLPREDNISOLONE SODIUM SUCC 125 MG IJ SOLR: 125.0000 mg | Freq: Once | INTRAMUSCULAR | Status: DC | PRN

## 2020-01-23 MED ORDER — DIPHENHYDRAMINE HCL 50 MG/ML IJ SOLN: 50.0000 mg | Freq: Once | INTRAMUSCULAR | Status: DC | PRN

## 2020-01-23 MED ORDER — HYDROCOD POLST-CPM POLST ER 10-8 MG/5ML PO SUER
5.0000 mL | Freq: Once | ORAL | Status: AC
  Administered 2020-01-23: 5 mL via ORAL
  Filled 2020-01-23: qty 5

## 2020-01-23 MED ORDER — SODIUM CHLORIDE 0.9 % IV SOLN: INTRAVENOUS | Status: DC | PRN

## 2020-01-23 MED ORDER — HYDROCODONE-GUAIFENESIN 2.5-200 MG/5ML PO SOLN: 5.0000 mL | Freq: Two times a day (BID) | ORAL | 0 refills | Status: DC | PRN

## 2020-01-23 MED ORDER — FAMOTIDINE IN NACL 20-0.9 MG/50ML-% IV SOLN
20.0000 mg | Freq: Once | INTRAVENOUS | Status: DC | PRN
  Filled 2020-01-23: qty 50

## 2020-01-23 MED ORDER — ACETAMINOPHEN 325 MG PO TABS
650.0000 mg | ORAL_TABLET | Freq: Once | ORAL | Status: AC
  Administered 2020-01-23: 650 mg via ORAL
  Filled 2020-01-23: qty 2

## 2020-01-23 NOTE — ED Triage Notes (Signed)
Pt tested positive for COVID on Wednesday, reports sharp centralized chest pain. Resp e.u

## 2020-01-23 NOTE — ED Provider Notes (Signed)
[facility] EMERGENCY DEPARTMENT Provider Note   CSN: [id number] Arrival date & time: 01/23/20  1356     History Chief Complaint  Patient presents with  . Covid Positive  . Chest Pain    Cory Bell is a 39 y.o. male with known COVID-19 infection diagnosed on 01/20/2020 who presents today on day 9 of symptoms with worsening central chest pain and chest heaviness.  Patient has been experiencing myalgias, fevers, chills, headaches, diarrhea, shortness of breath or cough since onset of infection, however states chest pain is new in the last few days and has been getting worse.  He was directed here by his report back to is a physician, requesting CT of his chest to rule out blood clot in his lungs.  Patient worsens diarrhea with 8 episodes of watery diarrhea today, similar amount daily for the last 3 days. Denies blood in his stool, denies hematochezia or melena. Endorses loss of taste and smell.  Patient has taken Hydrea, Decadron, Levaquin, Z-Pak for his COVID-19 infection, prescribed by outside providers.  I personally reviewed the patient's medical records.  He has history of anxiety, but is otherwise a healthy 39 year old male, not on any medications every day.  HPI     Past Medical History:  Diagnosis Date  . Anxiety     There are no problems to display for this patient.   History reviewed. No pertinent surgical history.    No family history on file.  Social History   Tobacco Use  . Smoking status: Never Smoker  Substance Use Topics  . Alcohol use: Yes    Alcohol/week: 14.0 standard drinks    Types: 14 drink(s) per week  . Drug use: No    Home Medications Prior to Admission medications   Medication Sig Start Date End Date Taking? Authorizing Provider  acetaminophen (TYLENOL) 500 MG tablet Take 500 mg by mouth every 6 (six) hours as needed for moderate pain.   Yes [provider]  albuterol (VENTOLIN HFA) 108 (90 Base)  MCG/ACT inhaler Inhale 2 puffs into the lungs every 6 (six) hours as needed for wheezing or shortness of breath. 01/22/20  Yes [provider]  benzonatate (TESSALON) 200 MG capsule Take 200 mg by mouth in the morning, at noon, and at bedtime.   Yes [provider]  cholecalciferol (VITAMIN D3) 25 MCG (1000 UNIT) tablet Take 1,000 Units by mouth daily.   Yes [provider]  dexamethasone (DECADRON) 2 MG tablet Take 4 mg by mouth as directed. Take 2 tablets (4 mg) for 3 Days Then Take 1 tablet (2 mg) for 4 Days   Yes [provider]  dextromethorphan-guaiFENesin (MUCINEX DM) 30-600 MG 12hr tablet Take 1 tablet by mouth 2 (two) times daily.   Yes [provider]  folic acid (FOLVITE) 1 MG tablet Take 2 mg by mouth daily.   Yes [provider]  hydroxyurea (HYDREA) 500 MG capsule Take 1,000 mg by mouth daily. May take with food to minimize GI side effects.   Yes [provider]  levofloxacin (LEVAQUIN) 750 MG tablet Take 750 mg by mouth daily.   Yes [provider]  methylphenidate 36 MG PO CR tablet Take 36 mg by mouth every morning. 12/15/19  Yes [provider]  Omega-3 Fatty Acids (FISH OIL) 1000 MG CAPS Take 1 capsule by mouth daily.   Yes [provider]  vitamin C (ASCORBIC ACID) 250 MG tablet Take 250 mg by mouth daily.  Yes [provider]  amoxicillin-clavulanate (AUGMENTIN) 875-125 MG per tablet Take 1 tablet by mouth 2 (two) times daily. Patient not taking: No sig reported 09/28/14   Ofilia Neas, PA-C  naproxen (NAPROSYN) 500 MG tablet Take 1 tablet (500 mg total) by mouth 2 (two) times daily with a meal. Patient not taking: No sig reported 09/28/14   Ofilia Neas, PA-C    Allergies    Septra [sulfamethoxazole-trimethoprim]  Review of Systems   Review of Systems  Constitutional: Positive for activity change, appetite change, chills, fatigue and fever.  HENT: Positive for  congestion, rhinorrhea and sore throat. Negative for trouble swallowing and voice change.   Eyes: Negative.   Respiratory: Positive for cough, chest tightness, shortness of breath and wheezing.   Cardiovascular: Positive for chest pain. Negative for palpitations and leg swelling.  Gastrointestinal: Positive for abdominal pain and diarrhea. Negative for blood in stool, nausea and vomiting.  Endocrine: Negative.   Genitourinary: Negative for dysuria, frequency and urgency.  Musculoskeletal: Positive for myalgias. Negative for neck pain and neck stiffness.  Skin: Negative.   Neurological: Positive for dizziness, facial asymmetry, weakness, light-headedness and headaches. Negative for tremors, seizures and syncope.  Hematological: Negative.     Physical Exam Updated Vital Signs BP 131/74   Pulse 79   Temp 98.2 F (36.8 C) (Oral)   Resp 18   Ht 6\' 2"  (1.88 m)   Wt 111.1 kg   SpO2 96%   BMI 31.46 kg/m   Physical Exam Vitals and nursing note reviewed.  HENT:     Head: Normocephalic and atraumatic.     Right Ear: External ear normal.     Left Ear: External ear normal.     Nose: Nose normal.     Mouth/Throat:     Mouth: Mucous membranes are moist.     Palate: No mass.     Pharynx: Oropharynx is clear. Uvula midline. No oropharyngeal exudate or posterior oropharyngeal erythema.     Tonsils: No tonsillar exudate.  Eyes:     General:        Right eye: No discharge.        Left eye: No discharge.     Extraocular Movements: Extraocular movements intact.     Conjunctiva/sclera: Conjunctivae normal.     Pupils: Pupils are equal, round, and reactive to light.  Neck:     Trachea: Trachea and phonation normal.  Cardiovascular:     Rate and Rhythm: Normal rate and regular rhythm.     Pulses: Normal pulses.          Radial pulses are 2+ on the right side and 2+ on the left side.       Dorsalis pedis pulses are 2+ on the right side and 2+ on the left side.     Heart sounds: Normal heart  sounds. No murmur heard.   Pulmonary:     Effort: Pulmonary effort is normal. No respiratory distress.     Breath sounds: Decreased air movement present. Examination of the right-lower field reveals rales. Examination of the left-lower field reveals rales. Rales present. No wheezing.  Chest:     Chest wall: No lacerations, deformity, swelling, tenderness, crepitus or edema.  Abdominal:     General: Bowel sounds are normal. There is no distension.     Palpations: Abdomen is soft.     Tenderness: There is no abdominal tenderness.  Musculoskeletal:        General: No deformity.  Cervical back: Neck supple. No edema, rigidity or crepitus. No pain with movement, spinous process tenderness or muscular tenderness.     Right lower leg: No edema.     Left lower leg: No edema.  Lymphadenopathy:     Cervical: No cervical adenopathy.  Skin:    General: Skin is warm and dry.  Neurological:     General: No focal deficit present.     Mental Status: He is alert and oriented to person, place, and time. Mental status is at baseline.     Sensory: Sensation is intact.     Motor: Motor function is intact.  Psychiatric:        Mood and Affect: Mood normal.    ED Results / Procedures / Treatments   Labs (all labs ordered are listed, but only abnormal results are displayed) Labs Reviewed - No data to display  EKG None  Radiology No results found.  Procedures Procedures (including critical care time)  Medications Ordered in ED Medications - No data to display  ED Course  I have reviewed the triage vital signs and the nursing notes.  Pertinent labs & imaging results that were available during my care of the patient were reviewed by me and considered in my medical decision making (see chart for details).    MDM Rules/Calculators/A&P                         39 year old male with known COVID-19 infection who presents with concern for worsening central chest pain that is sharp in nature,  sensation of chest heaviness.  Concern for ACS, PE, dissection in this patient; differential diagnosis also includes pneumonia, pleural effusion, musculoskeletal pain secondary to prolonged coughing.   Vital signs are normal on intake.  Physical exam with decreased air movement in the lungs bilaterally, patient appears flushed in the chest and on the back.  Cardiac exam is normal.  We will proceed with basic laboratory studies, chest x-ray, EKG, and CT PE study.    Studies are pending at time of shift change. Care of this patient signed out to oncoming PA, Shawn Joy, [place]. Physical exam findings and HPI communicated with him. I appreciate his care in the collaboration of this patient.   Final Clinical Impression(s) / ED Diagnoses Final diagnoses:  None    Rx / DC Orders ED Discharge Orders    None       Sherrilee Gilles 01/23/20 1609    Terrilee Files, MD 01/23/20 2206

## 2020-01-23 NOTE — ED Notes (Signed)
Patient verbalizes understanding of discharge instructions. Opportunity for questioning and answers were provided. Pt currently awaiting for transportation.

## 2020-01-23 NOTE — Discharge Instructions (Addendum)
There was a lung abnormality noted on the CT scan.  This will need to be reevaluated with a CT of the chest with contrast in 4 to 6 weeks.  This can also be followed up on by a pulmonologist.  There was reported cardiomegaly, or enlargement to the heart, noted on the CT of the chest.  This should be further evaluated by a cardiologist.   COVID-19 Home Management:  You have had a positive test for COVID-19. COVID-19 is caused by a virus. Viruses do not require or respond to antibiotics. Treatment is symptomatic care and it is important to note that these symptoms may last for 7-14 days.   Hand washing: Wash your hands throughout the day, but especially before and after touching the face, using the restroom, sneezing, coughing, or touching surfaces that have been coughed or sneezed upon. Hydration: Symptoms of most illnesses will be intensified and complicated by dehydration. Dehydration can also extend the duration of symptoms. Drink plenty of fluids and get plenty of rest. You should be drinking at least half a liter of water an hour to stay hydrated. Electrolyte drinks (ex. Gatorade, Powerade, Pedialyte) are also encouraged. You should be drinking enough fluids to make your urine light yellow, almost clear. If this is not the case, you are not drinking enough water. Please note that some of the treatments indicated below will not be effective if you are not adequately hydrated. Diet: Please concentrate on hydration, however, you may introduce food slowly.  Start with a clear liquid diet, progressed to a full liquid diet, and then bland solids as you are able. Pain or fever: Ibuprofen, Naproxen, or acetaminophen (generic for Tylenol) for pain or fever.  Antiinflammatory medications: Take 600 mg of ibuprofen every 6 hours or 440 mg (over the counter dose) to 500 mg (prescription dose) of naproxen every 12 hours for the next 3 days. After this time, these medications may be used as needed for pain. Take  these medications with food to avoid upset stomach. Choose only one of these medications, do not take them together. Acetaminophen (generic for Tylenol): Should you continue to have additional pain while taking the ibuprofen or naproxen, you may add in acetaminophen as needed. Your daily total maximum amount of acetaminophen from all sources should be limited to 4000mg /day for persons without liver problems, or 2000mg /day for those with liver problems. Diarrhea: May use medications such as loperamide (Imodium) or Bismuth subsalicylate (Pepto-Bismol). Cough: Teas, warm liquids, broths, and honey can help with cough. The hydrocodone-based cough syrup can also help with severe cough.  Do not use this medication more than prescribed. Zyrtec or Claritin: May add these medication daily to control underlying symptoms of congestion, sneezing, and other signs of allergies.  These medications are available over-the-counter. Generics: Cetirizine (generic for Zyrtec) and loratadine (generic for Claritin). Fluticasone: Use fluticasone (generic for Flonase), as directed, for nasal and sinus congestion.  This medication is available over-the-counter. Congestion: Plain guaifenesin (generic for plain Mucinex) may help relieve congestion. Saline sinus rinses and saline nasal sprays may also help relieve congestion.  Sore throat: Warm liquids or Chloraseptic spray may help soothe a sore throat. Gargle twice a day with a salt water solution made from a half teaspoon of salt in a cup of warm water.  Follow up: Follow up with a primary care provider within the next two weeks should symptoms fail to resolve. Return: Return to the ED for significantly worsening symptoms, shortness of breath, persistent/worsening chest pain, persistent  vomiting, large amounts of blood in stool, worsening/localized abdominal pain, or any other major concerns.  For prescription assistance, may try using prescription discount sites or apps, such as  goodrx.com  COVID-19 isolation recommendations  Patients who have symptoms consistent with COVID-19 should self isolate until: At least 3 days (72 hours) have passed since recovery, defined as resolution of fever without the use of fever reducing medications and improvement in respiratory symptoms (e.g., cough, shortness of breath), and At least 7 days have passed since symptoms first appeared. Retesting is not required and not recommended as patients can continue to test positive for several weeks despite lack of symptoms.

## 2020-01-23 NOTE — ED Notes (Signed)
[phone] Wife wants an update

## 2020-01-23 NOTE — ED Notes (Signed)
Pt ambulated well on Pulse Oximetry within the Examination Room. Pt maintained Oxygen Saturation at 97% and above with no increase in SOB. Slight increase in Cough and a slight dizziness noted but no other complaints. Patient returned to stretcher.

## 2020-01-23 NOTE — ED Notes (Signed)
Pt transported to CT ?

## 2020-01-23 NOTE — ED Provider Notes (Signed)
Cory Bell is a 39 y.o. male, presenting to the ED with chest discomfort.  At the time of my evaluation patient states he feels as though his symptoms have improved since ED arrival.   HPI from Loleta Dicker, PA-C: "Cory Bell is a 39 y.o. male with known COVID-19 infection diagnosed on 01/20/2020 who presents today on day 9 of symptoms with worsening central chest pain and chest heaviness.  Patient has been experiencing myalgias, fevers, chills, headaches, diarrhea, shortness of breath or cough since onset of infection, however states chest pain is new in the last few days and has been getting worse.  He was directed here by his report back to is a physician, requesting CT of his chest to rule out blood clot in his lungs.  Patient worsens diarrhea with 8 episodes of watery diarrhea today, similar amount daily for the last 3 days. Denies blood in his stool, denies hematochezia or melena. Endorses loss of taste and smell.  Patient has taken Hydrea, Decadron, Levaquin, Z-Pak for his COVID-19 infection, prescribed by outside providers.  I personally reviewed the patient's medical records.  He has history of anxiety, but is otherwise a healthy 39 year old male, not on any medications every day."  Past Medical History:  Diagnosis Date  . Anxiety     Physical Exam  BP 133/85   Pulse 83   Temp 98.2 F (36.8 C) (Oral)   Resp 16   Ht 6\' 2"  (1.88 m)   Wt 111.1 kg   SpO2 100%   BMI 31.46 kg/m   Physical Exam Vitals and nursing note reviewed.  Constitutional:      General: He is not in acute distress.    Appearance: He is well-developed. He is not diaphoretic.  HENT:     Head: Normocephalic and atraumatic.     Mouth/Throat:     Mouth: Mucous membranes are moist.     Pharynx: Oropharynx is clear.  Eyes:     Conjunctiva/sclera: Conjunctivae normal.  Cardiovascular:     Rate and Rhythm: Normal rate and regular rhythm.     Pulses: Normal pulses.          Radial  pulses are 2+ on the right side and 2+ on the left side.       Posterior tibial pulses are 2+ on the right side and 2+ on the left side.     Heart sounds: Normal heart sounds.     Comments: Tactile temperature in the extremities appropriate and equal bilaterally. Pulmonary:     Effort: Tachypnea present. No respiratory distress.     Breath sounds: Normal breath sounds.     Comments: Speaks in full sentences without noted difficulty. Chest:     Chest wall: Tenderness present.  Abdominal:     Palpations: Abdomen is soft.     Tenderness: There is no abdominal tenderness. There is no guarding.  Musculoskeletal:     Cervical back: Neck supple.     Right lower leg: No edema.     Left lower leg: No edema.  Skin:    General: Skin is warm and dry.  Neurological:     Mental Status: He is alert.  Psychiatric:        Mood and Affect: Mood and affect normal.        Speech: Speech normal.        Behavior: Behavior normal.     ED Course/Procedures     Procedures   Abnormal Labs Reviewed  COMPREHENSIVE  METABOLIC PANEL - Abnormal; Notable for the following components:      Result Value   Sodium 134 (*)    Glucose, Bld 131 (*)    Creatinine, Ser 1.31 (*)    Calcium 8.8 (*)    AST 43 (*)    ALT 51 (*)    All other components within normal limits  CBC WITH DIFFERENTIAL/PLATELET - Abnormal; Notable for the following components:   Platelets 113 (*)    Lymphs Abs 0.6 (*)    All other components within normal limits  URINALYSIS, ROUTINE W REFLEX MICROSCOPIC - Abnormal; Notable for the following components:   Ketones, ur 5 (*)    All other components within normal limits    CT Angio Chest PE W/Cm &/Or Wo Cm  Result Date: 01/23/2020 CLINICAL DATA:  COVID positive. EXAM: CT ANGIOGRAPHY CHEST WITH CONTRAST TECHNIQUE: Multidetector CT imaging of the chest was performed using the standard protocol during bolus administration of intravenous contrast. Multiplanar CT image reconstructions and  MIPs were obtained to evaluate the vascular anatomy. CONTRAST:  20mL OMNIPAQUE IOHEXOL 350 MG/ML SOLN COMPARISON:  None. FINDINGS: Cardiovascular: Contrast injection is sufficient to demonstrate satisfactory opacification of the pulmonary arteries to the segmental level. There is no pulmonary embolus or evidence of right heart strain. The size of the main pulmonary artery is normal. Moderate cardiomegaly. The course and caliber of the aorta are normal. There is no atherosclerotic calcification. Opacification decreased due to pulmonary arterial phase contrast bolus timing. Mediastinum/Nodes: -- No mediastinal lymphadenopathy. -- No hilar lymphadenopathy. -- No axillary lymphadenopathy. -- No supraclavicular lymphadenopathy. -- Normal thyroid gland where visualized. -  Unremarkable esophagus. Lungs/Pleura: There are diffuse bilateral ground-glass airspace opacities. There is extensive consolidation in the left lower lobe. There is a rounded mass in the left upper lobe measuring 2 cm (axial series 6, image 48). There is no pneumothorax or large pleural effusion. Upper Abdomen: Contrast bolus timing is not optimized for evaluation of the abdominal organs. The visualized portions of the organs of the upper abdomen are normal. Musculoskeletal: No chest wall abnormality. No bony spinal canal stenosis. Review of the MIP images confirms the above findings. IMPRESSION: 1. No acute pulmonary embolism. 2. Diffuse bilateral ground-glass airspace opacities and extensive consolidation in the left lower lobe. Findings are consistent with multifocal pneumonia (viral or bacterial). 3. Rounded 2 cm mass in the left upper lobe. While this may be secondary to an infectious process, a pulmonary mass is not excluded. A 4-6 week follow-up contrast enhanced CT of the chest is recommended to confirm resolution of this finding. 4. Cardiomegaly. Electronically Signed   By: Katherine Mantle M.D.   On: 01/23/2020 17:51   DG Chest Portable 1  View  Result Date: 01/23/2020 CLINICAL DATA:  39 year old male with shortness of breath and chest pain. Positive COVID-19. EXAM: PORTABLE CHEST 1 VIEW COMPARISON:  Chest radiograph dated 03/24/2013 FINDINGS: Faint bilateral confluent pulmonary opacities consistent with multifocal pneumonia and in keeping with COVID-19. Clinical correlation is recommended. There is no pleural effusion pneumothorax. The cardiac silhouette is within limits. No acute osseous pathology. IMPRESSION: Multifocal pneumonia. Electronically Signed   By: Elgie Collard M.D.   On: 01/23/2020 15:58     EKG Interpretation  Date/Time:  Saturday January 23 2020 14:14:58 EST Ventricular Rate:  79 PR Interval:    QRS Duration: 100 QT Interval:  340 QTC Calculation: 390 R Axis:   52 Text Interpretation: Sinus rhythm No significant change since prior 2/15 Confirmed by Charm Barges,  Casimiro Needle [phone]) on 01/23/2020 4:41:26 PM       MDM   Clinical Course as of 01/24/20 0056  Sat Jan 23, 2020  2318 Pharmacist with [facility] called and tells me the cough medicine I originally ordered is not available.  She states Tussionex Pennkinetic ER is an acceptable alternative.  [SJ]  2348 Pharmacist from [facility] called back. States she didn't receive the new prescription.  Resent prescription. [SJ]    Clinical Course User Index [SJ] Anselm Pancoast, PA-C   Patient care handoff report received from [facility]. PA-C. Plan: Pending CT PE study.    Patient is already taking Decadron, therefore he was not given steroids here in the ED. Symptoms improved during ED course. CT without evidence of PE. Mass noted, discussed with patient as well as recommendation for repeat CT.  He was also referred to pulmonology on this matter. Cardiomegaly also discussed with the patient.  Referred to cardiology.  Patient given MAB infusion here in the ED since he is on day 9 of his infection.  Patient ambulated prior to discharge without  increased work of breathing, evidence of shortness of breath, increased chest discomfort, and while maintaining SPO2 above 95% on room air.  The patient was given instructions for home care as well as return precautions. Patient voices understanding of these instructions, accepts the plan, and is comfortable with discharge.  I reviewed and interpreted the patient's labs and radiological studies.   Vitals:   01/23/20 2115 01/23/20 2200 01/23/20 2227 01/23/20 2344  BP: 130/77 138/80 138/80 117/74  Pulse: 83 89 95 97  Resp: 16 (!) 21 20 16   Temp: 99.5 F (37.5 C) 100.3 F (37.9 C) 100.3 F (37.9 C)   TempSrc: Oral Oral Oral   SpO2: 98% 98% 98% 93%  Weight:      Height:          01/24/20 0110    01/26/20, MD 01/24/20 [phone]

## 2020-01-24 NOTE — ED Notes (Signed)
Pt physically discharged by Sula Rumple, RN

## 2020-03-21 DIAGNOSIS — Z6831 Body mass index (BMI) 31.0-31.9, adult: Secondary | ICD-10-CM | POA: Diagnosis not present

## 2020-03-21 DIAGNOSIS — F908 Attention-deficit hyperactivity disorder, other type: Secondary | ICD-10-CM | POA: Diagnosis not present

## 2020-03-21 DIAGNOSIS — Z79899 Other long term (current) drug therapy: Secondary | ICD-10-CM | POA: Diagnosis not present

## 2020-03-21 DIAGNOSIS — F411 Generalized anxiety disorder: Secondary | ICD-10-CM | POA: Diagnosis not present

## 2020-06-21 DIAGNOSIS — Z6831 Body mass index (BMI) 31.0-31.9, adult: Secondary | ICD-10-CM | POA: Diagnosis not present

## 2020-06-21 DIAGNOSIS — I517 Cardiomegaly: Secondary | ICD-10-CM | POA: Diagnosis not present

## 2020-06-21 DIAGNOSIS — Z1331 Encounter for screening for depression: Secondary | ICD-10-CM | POA: Diagnosis not present

## 2020-06-21 DIAGNOSIS — R9389 Abnormal findings on diagnostic imaging of other specified body structures: Secondary | ICD-10-CM | POA: Diagnosis not present

## 2020-06-29 DIAGNOSIS — R002 Palpitations: Secondary | ICD-10-CM | POA: Diagnosis not present

## 2020-06-29 DIAGNOSIS — Z8616 Personal history of COVID-19: Secondary | ICD-10-CM | POA: Diagnosis not present

## 2020-06-29 DIAGNOSIS — U071 COVID-19: Secondary | ICD-10-CM | POA: Diagnosis not present

## 2020-07-11 DIAGNOSIS — R002 Palpitations: Secondary | ICD-10-CM | POA: Diagnosis not present

## 2020-07-11 DIAGNOSIS — Z8616 Personal history of COVID-19: Secondary | ICD-10-CM | POA: Diagnosis not present

## 2020-07-25 ENCOUNTER — Ambulatory Visit: Payer: BC Managed Care – PPO | Admitting: Pulmonary Disease

## 2020-07-25 ENCOUNTER — Encounter: Payer: Self-pay | Admitting: Pulmonary Disease

## 2020-07-25 ENCOUNTER — Other Ambulatory Visit: Payer: Self-pay

## 2020-07-25 VITALS — BP 110/76 | HR 80 | Ht 74.0 in | Wt 234.6 lb

## 2020-07-25 DIAGNOSIS — J1282 Pneumonia due to coronavirus disease 2019: Secondary | ICD-10-CM | POA: Diagnosis not present

## 2020-07-25 DIAGNOSIS — R9389 Abnormal findings on diagnostic imaging of other specified body structures: Secondary | ICD-10-CM

## 2020-07-25 DIAGNOSIS — U071 COVID-19: Secondary | ICD-10-CM | POA: Diagnosis not present

## 2020-07-25 NOTE — Patient Instructions (Signed)
We will schedule you for a CT Chest scan to follow up your previous CT chest findings.   We will call you with the results.

## 2020-07-25 NOTE — Progress Notes (Addendum)
Synopsis: Referred in June 2022 for Abnormal CT Chest by Heide Guile, NP  Subjective:   PATIENT ID: Cory Bell GENDER: male DOB: 1980-02-12, MRN: [id number]  HPI  Chief Complaint  Patient presents with   Consult   Cory Bell is a 40 year old male, never smoker who is referred to pulmonary clinic for abnormal CT Chest scan.   He had covid 19 pneumonia 12/2019 where a CT chest showed bilateral pneumonia with no pulmonary emboli noted and questionable left upper lobe lung mass measuring 2cm with surrounding ground glass features.   He reports taking a couple months to recover after his hospitalization due to the ongoing dyspnea and fatigue.  He lost 26 pounds from having COVID.  He currently reports that he is asymptomatic without any shortness of breath with physical activity as he has resumed weightlifting.  He denies any cough or wheezing.  He is a never smoker and denies any vaping.  There is no family history of lung cancer.  He works as a Emergency planning/management officer for a [employer].  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  Past Medical History:  Diagnosis Date   Anxiety      No family history on file.   Social History   Socioeconomic History   Marital status: Significant Other    Spouse name: Not on file   Number of children: Not on file   Years of education: Not on file   Highest education level: Not on file  Occupational History   Not on file  Tobacco Use   Smoking status: Never   Smokeless tobacco: Never  Substance and Sexual Activity   Alcohol use: Yes    Alcohol/week: 14.0 standard drinks    Types: 14 drink(s) per week   Drug use: No   Sexual activity: Not on file  Other Topics Concern   Not on file  Social History Narrative   Not on file   Social Determinants of Health   Financial Resource Strain: Not on file  Food Insecurity: Not on file  Transportation Needs: Not on file  Physical Activity:  Not on file  Stress: Not on file  Social Connections: Not on file  Intimate Partner Violence: Not on file     Allergies  Allergen Reactions   Septra [Sulfamethoxazole-Trimethoprim] Rash     Outpatient Medications Prior to Visit  Medication Sig Dispense Refill   acetaminophen (TYLENOL) 500 MG tablet Take 500 mg by mouth every 6 (six) hours as needed for moderate pain. (Patient not taking: Reported on 07/25/2020)     albuterol (VENTOLIN HFA) 108 (90 Base) MCG/ACT inhaler Inhale 2 puffs into the lungs every 6 (six) hours as needed for wheezing or shortness of breath. (Patient not taking: Reported on 07/25/2020)     amoxicillin-clavulanate (AUGMENTIN) 875-125 MG per tablet Take 1 tablet by mouth 2 (two) times daily. (Patient not taking: No sig reported) 20 tablet 0   benzonatate (TESSALON) 200 MG capsule Take 200 mg by mouth in the morning, at noon, and at bedtime. (Patient not taking: Reported on 07/25/2020)     chlorpheniramine-HYDROcodone (TUSSIONEX PENNKINETIC ER) 10-8 MG/5ML SUER Take 5 mLs by mouth every 12 (twelve) hours as needed for cough. (Patient not taking: Reported on 07/25/2020) 50 mL 0   cholecalciferol (VITAMIN D3) 25 MCG (1000 UNIT) tablet Take 1,000 Units by mouth daily. (Patient not taking: Reported on 07/25/2020)     dexamethasone (  DECADRON) 2 MG tablet Take 4 mg by mouth as directed. Take 2 tablets (4 mg) for 3 Days Then Take 1 tablet (2 mg) for 4 Days (Patient not taking: Reported on 07/25/2020)     dextromethorphan-guaiFENesin (MUCINEX DM) 30-600 MG 12hr tablet Take 1 tablet by mouth 2 (two) times daily. (Patient not taking: Reported on 07/25/2020)     folic acid (FOLVITE) 1 MG tablet Take 2 mg by mouth daily. (Patient not taking: Reported on 07/25/2020)     hydroxyurea (HYDREA) 500 MG capsule Take 1,000 mg by mouth daily. May take with food to minimize GI side effects. (Patient not taking: Reported on 07/25/2020)     levofloxacin (LEVAQUIN) 750 MG tablet Take 750 mg by mouth daily.  (Patient not taking: Reported on 07/25/2020)     methylphenidate 36 MG PO CR tablet Take 36 mg by mouth every morning. (Patient not taking: Reported on 07/25/2020)     naproxen (NAPROSYN) 500 MG tablet Take 1 tablet (500 mg total) by mouth 2 (two) times daily with a meal. (Patient not taking: No sig reported) 30 tablet 0   Omega-3 Fatty Acids (FISH OIL) 1000 MG CAPS Take 1 capsule by mouth daily. (Patient not taking: Reported on 07/25/2020)     vitamin C (ASCORBIC ACID) 250 MG tablet Take 250 mg by mouth daily. (Patient not taking: Reported on 07/25/2020)     No facility-administered medications prior to visit.    Review of Systems  Constitutional:  Negative for chills, fever, malaise/fatigue and weight loss.  HENT:  Negative for congestion, sinus pain and sore throat.   Eyes: Negative.   Respiratory:  Negative for cough, hemoptysis, sputum production, shortness of breath and wheezing.   Cardiovascular:  Negative for chest pain, palpitations, orthopnea, claudication and leg swelling.  Gastrointestinal:  Negative for abdominal pain, heartburn, nausea and vomiting.  Genitourinary: Negative.   Musculoskeletal:  Negative for joint pain and myalgias.  Skin:  Negative for rash.  Neurological:  Negative for weakness.  Endo/Heme/Allergies: Negative.   Psychiatric/Behavioral: Negative.       Objective:   Vitals:   07/25/20 1450  BP: 110/76  Pulse: 80  SpO2: 94%  Weight: 234 lb 9.6 oz (106.4 kg)  Height: 6\' 2"  (1.88 m)     Physical Exam Constitutional:      General: He is not in acute distress.    Appearance: Normal appearance.  HENT:     Head: Normocephalic and atraumatic.  Eyes:     Extraocular Movements: Extraocular movements intact.     Conjunctiva/sclera: Conjunctivae normal.     Pupils: Pupils are equal, round, and reactive to light.  Cardiovascular:     Rate and Rhythm: Normal rate and regular rhythm.     Pulses: Normal pulses.     Heart sounds: Normal heart sounds. No  murmur heard. Pulmonary:     Effort: Pulmonary effort is normal.     Breath sounds: Normal breath sounds. No rales.  Abdominal:     General: Bowel sounds are normal.     Palpations: Abdomen is soft.  Musculoskeletal:     Right lower leg: No edema.     Left lower leg: No edema.  Lymphadenopathy:     Cervical: No cervical adenopathy.  Skin:    General: Skin is warm and dry.  Neurological:     General: No focal deficit present.     Mental Status: He is alert.  Psychiatric:        Mood and Affect: Mood  normal.        Behavior: Behavior normal.        Thought Content: Thought content normal.        Judgment: Judgment normal.    CBC    Component Value Date/Time   WBC 5.1 01/23/2020 1608   RBC 5.56 01/23/2020 1608   HGB 17.0 01/23/2020 1608   HCT 49.5 01/23/2020 1608   PLT 113 (L) 01/23/2020 1608   MCV 89.0 01/23/2020 1608   MCV 92.2 02/17/2012 1353   MCH 30.6 01/23/2020 1608   MCHC 34.3 01/23/2020 1608   RDW 12.4 01/23/2020 1608   LYMPHSABS 0.6 (L) 01/23/2020 1608   MONOABS 0.3 01/23/2020 1608   EOSABS 0.0 01/23/2020 1608   BASOSABS 0.0 01/23/2020 1608   BMP Latest Ref Rng & Units 01/23/2020 03/24/2013 07/07/2007  Glucose 70 - 99 mg/dL 403(K) 742(V) 956(L)  BUN 6 - 20 mg/dL 9 11 15   Creatinine 0.61 - 1.24 mg/dL ) 8.75(I 1.2  Sodium 135 - 145 mmol/L 134(L) 143 140  Potassium 3.5 - 5.1 mmol/L 4.3 3.9 3.3(L)  Chloride 98 - 111 mmol/L 98 102 104  CO2 22 - 32 mmol/L 24 25 -  Calcium 8.9 - 10.3 mg/dL 4.33) 9.6 -    Chest imaging: CTA Chest 01/23/20 1. No acute pulmonary embolism. 2. Diffuse bilateral ground-glass airspace opacities and extensive consolidation in the left lower lobe. Findings are consistent with multifocal pneumonia (viral or bacterial). 3. Rounded 2 cm mass in the left upper lobe. While this may be secondary to an infectious process, a pulmonary mass is not excluded. A 4-6 week follow-up contrast enhanced CT of the chest is recommended to confirm  resolution of this finding. 4. Cardiomegaly.  PFT: No flowsheet data found.  Assessment & Plan:   Pneumonia due to COVID-19 virus - Plan: CT Chest Wo Contrast  Abnormal CT of the chest - Plan: CT Chest Wo Contrast  Discussion: Tycen Dockter is a 40 year old male, never smoker who is referred to pulmonary clinic for abnormal CT Chest scan.   Patient has made a significant recovery since his COVID-19 infection in 12/2019.  We will repeat a CT chest scan without contrast to follow-up his scan during his COVID illness.  He has no significant risk factors for developing lung cancer but given the dense infiltrates on his previous scan we will follow this up for monitoring of resolution of these lesions.   Patient is to follow-up as needed and we will call him with the results once the scan is done.  01/2020, MD [facility] Office: [phone]   No current outpatient medications on file.

## 2020-08-09 ENCOUNTER — Ambulatory Visit (INDEPENDENT_AMBULATORY_CARE_PROVIDER_SITE_OTHER)
Admission: RE | Admit: 2020-08-09 | Discharge: 2020-08-09 | Disposition: A | Discharge status: Home / Self Care | Payer: BC Managed Care – PPO | Source: Ambulatory Visit | Attending: Pulmonary Disease | Admitting: Pulmonary Disease

## 2020-08-09 ENCOUNTER — Other Ambulatory Visit: Payer: Self-pay

## 2020-08-09 DIAGNOSIS — J1282 Pneumonia due to coronavirus disease 2019: Secondary | ICD-10-CM | POA: Diagnosis not present

## 2020-08-09 DIAGNOSIS — R9389 Abnormal findings on diagnostic imaging of other specified body structures: Secondary | ICD-10-CM | POA: Diagnosis not present

## 2020-08-09 DIAGNOSIS — U071 COVID-19: Secondary | ICD-10-CM

## 2020-08-09 DIAGNOSIS — J189 Pneumonia, unspecified organism: Secondary | ICD-10-CM | POA: Diagnosis not present

## 2020-09-19 DIAGNOSIS — Z1331 Encounter for screening for depression: Secondary | ICD-10-CM | POA: Diagnosis not present

## 2020-09-19 DIAGNOSIS — F419 Anxiety disorder, unspecified: Secondary | ICD-10-CM | POA: Diagnosis not present

## 2020-09-19 DIAGNOSIS — J309 Allergic rhinitis, unspecified: Secondary | ICD-10-CM | POA: Diagnosis not present

## 2020-09-19 DIAGNOSIS — F908 Attention-deficit hyperactivity disorder, other type: Secondary | ICD-10-CM | POA: Diagnosis not present

## 2020-09-19 DIAGNOSIS — Z79899 Other long term (current) drug therapy: Secondary | ICD-10-CM | POA: Diagnosis not present

## 2021-03-02 DIAGNOSIS — F908 Attention-deficit hyperactivity disorder, other type: Secondary | ICD-10-CM | POA: Diagnosis not present

## 2021-03-02 DIAGNOSIS — F32A Depression, unspecified: Secondary | ICD-10-CM | POA: Diagnosis not present

## 2021-03-02 DIAGNOSIS — F419 Anxiety disorder, unspecified: Secondary | ICD-10-CM | POA: Diagnosis not present

## 2021-03-02 DIAGNOSIS — Z79899 Other long term (current) drug therapy: Secondary | ICD-10-CM | POA: Diagnosis not present

## 2022-04-11 DIAGNOSIS — J3089 Other allergic rhinitis: Secondary | ICD-10-CM | POA: Diagnosis not present

## 2022-04-11 DIAGNOSIS — J069 Acute upper respiratory infection, unspecified: Secondary | ICD-10-CM | POA: Diagnosis not present

## 2022-04-11 DIAGNOSIS — R6889 Other general symptoms and signs: Secondary | ICD-10-CM | POA: Diagnosis not present

## 2022-10-06 IMAGING — CT CT CHEST W/O CM
2 of 4 series · 15 of 36 positions shown, 18 images · non-contrast
Comparison: 01/23/2020

CLINICAL DATA: COVID pneumonia, follow-up abnormal CT

EXAM:
CT CHEST WITHOUT CONTRAST
TECHNIQUE: Multidetector CT imaging of the chest was performed following the
standard protocol without IV contrast.

[Series 2: thorax · axial · 0.79mm/px · z∈[-357,-87]mm · 12 of 161 slices shown, 15 images]
[im 13/161  mediastinal]
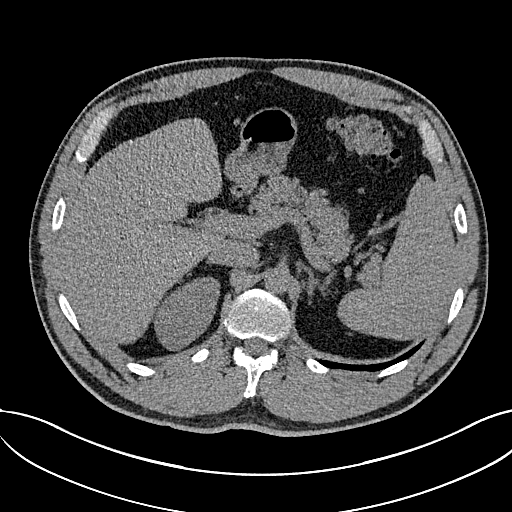
[im 13/161  lung]
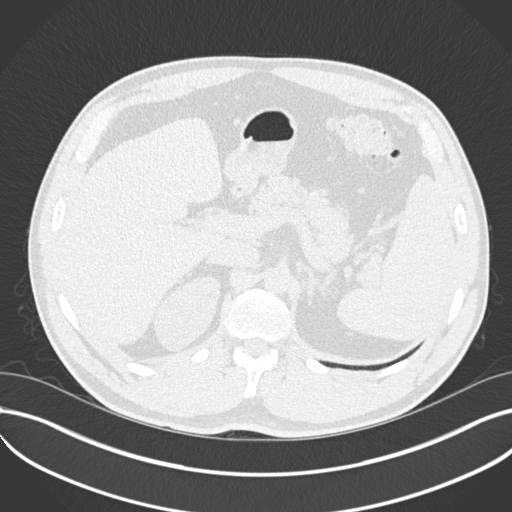
[im 25/161  lung]
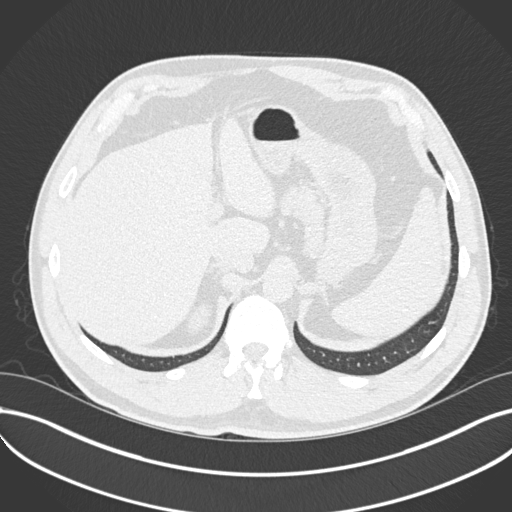
[im 37/161  lung]
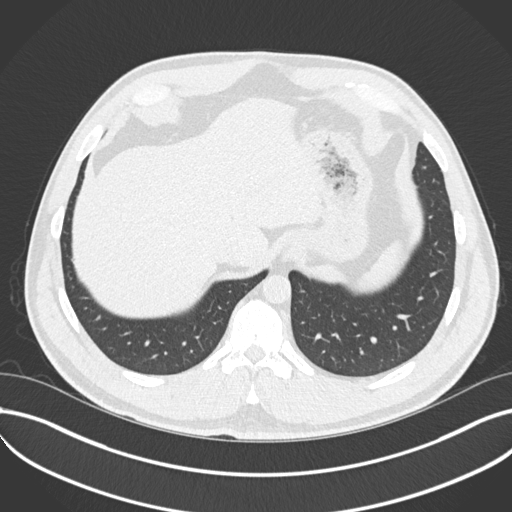
[im 50/161  lung]
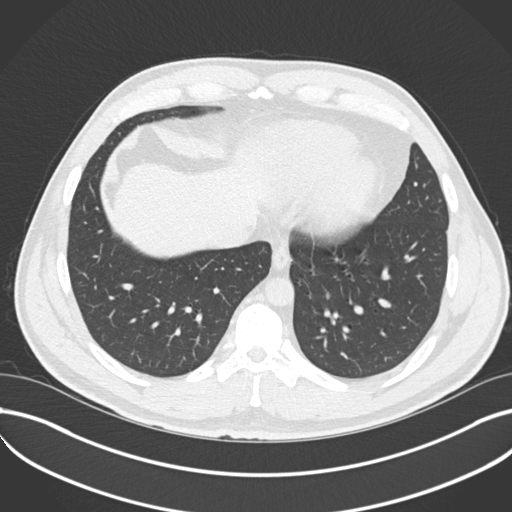
[im 62/161  mediastinal]
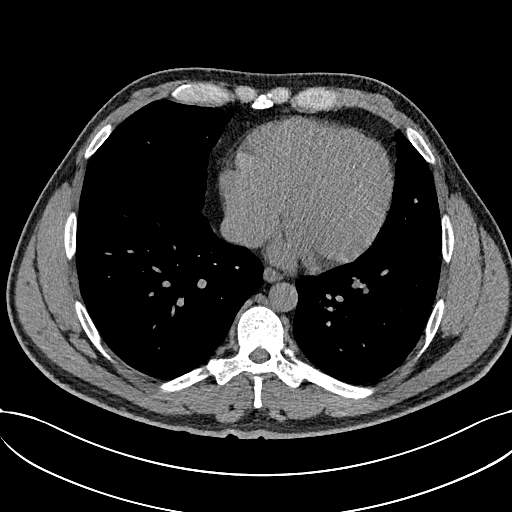
[im 62/161  lung]
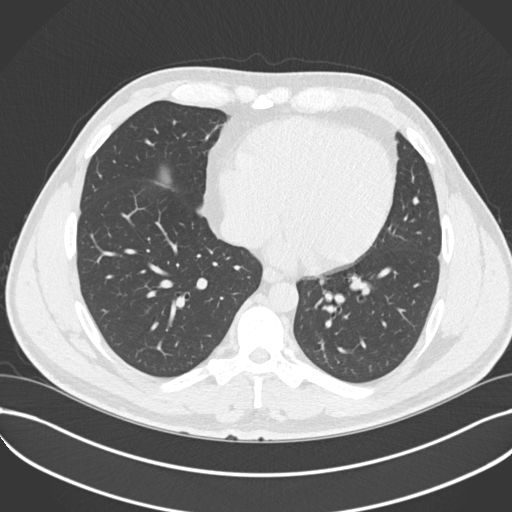
[im 74/161  lung]
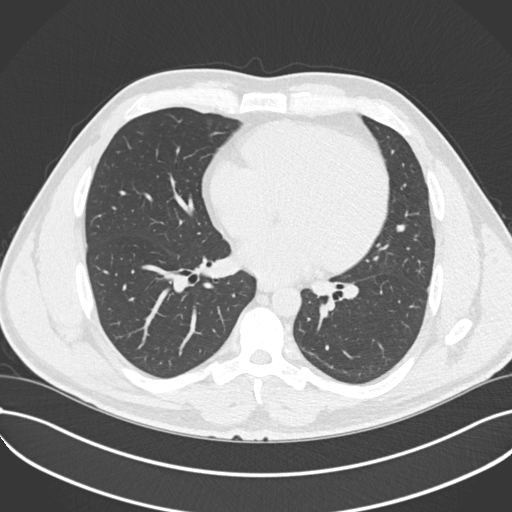
[im 87/161  lung]
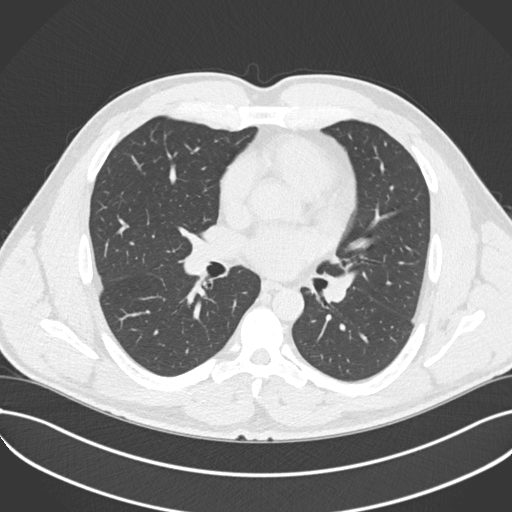
[im 99/161  lung]
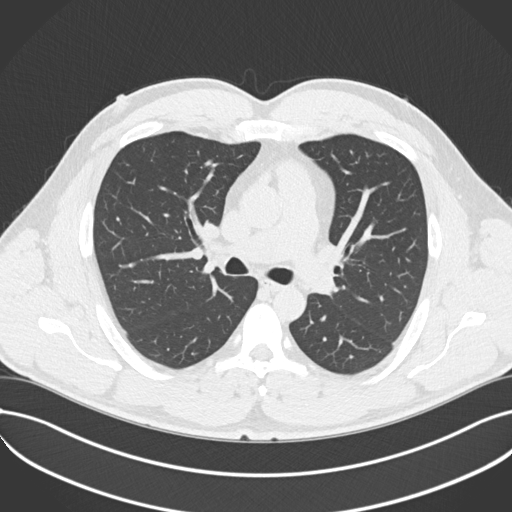
[im 111/161  mediastinal]
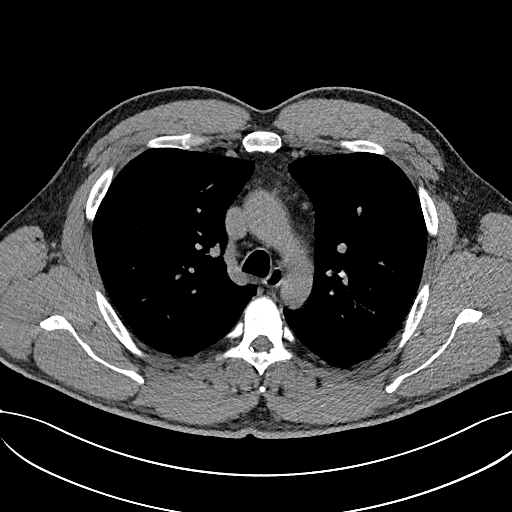
[im 111/161  lung]
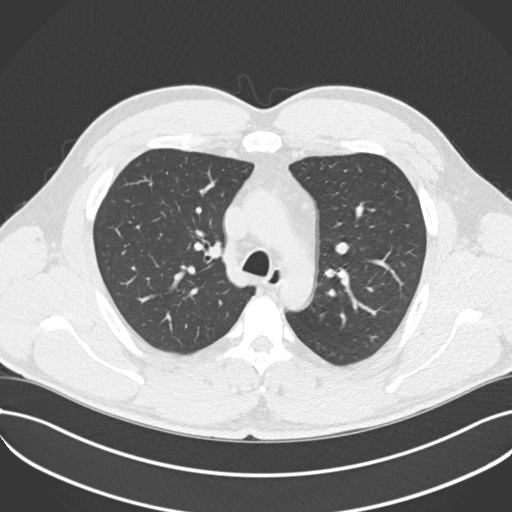
[im 124/161  lung]
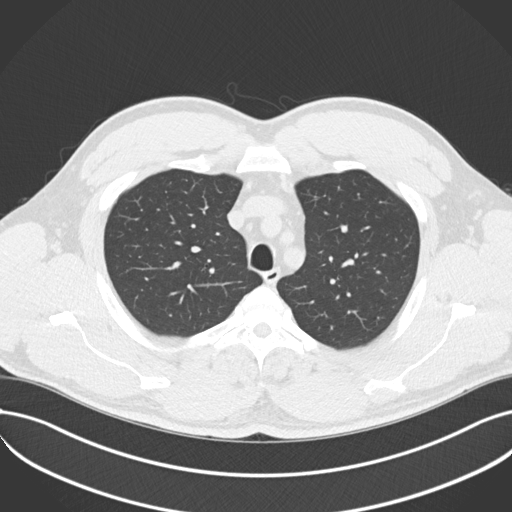
[im 136/161  lung]
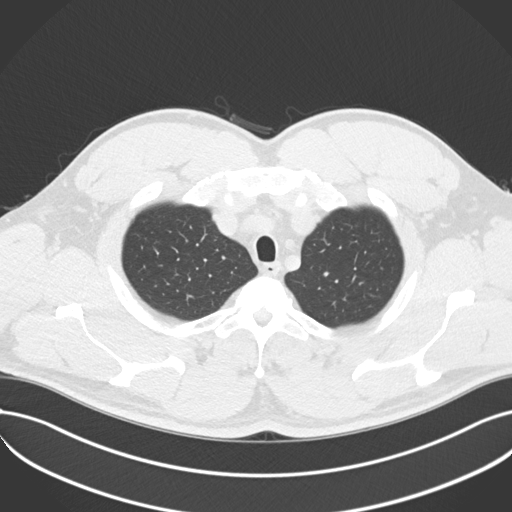
[im 148/161  lung]
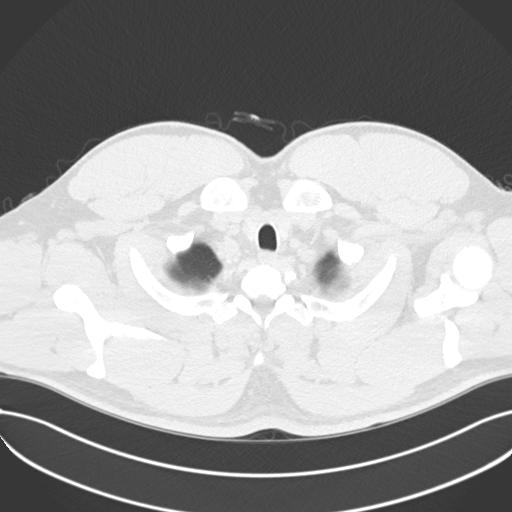

[Series 5: coronal · coronal · 0.63mm/px · 3 of 140 slices shown]
[im 28/140  lung]
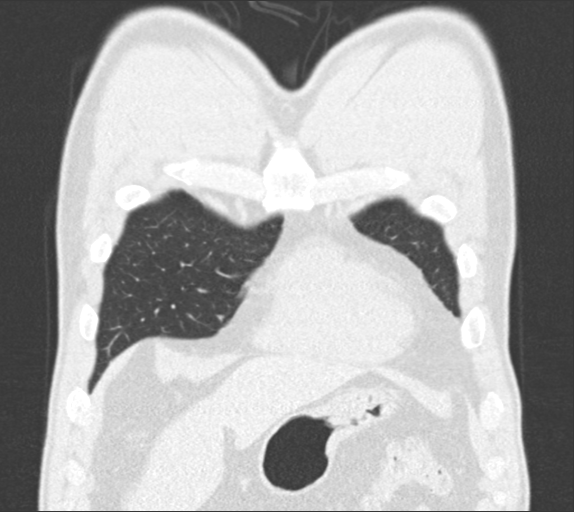
[im 56/140  lung]
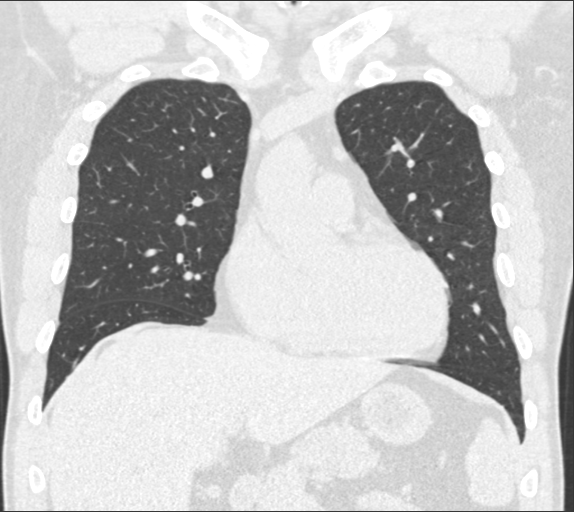
[im 84/140  lung]
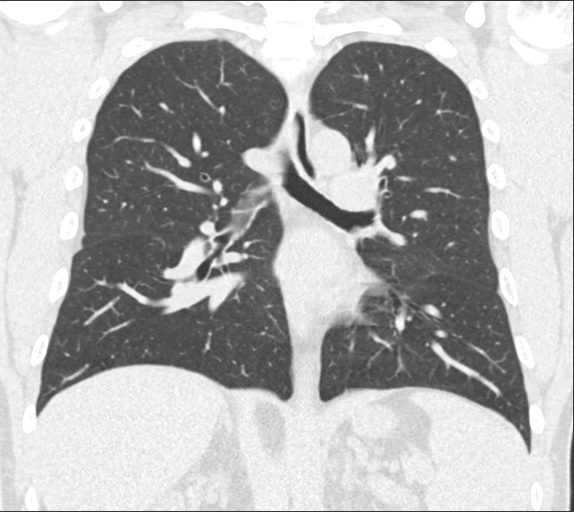

[15 of 36 positions shown; findings below may reference images not displayed]

FINDINGS: Cardiovascular: Normal heart size. No pericardial effusion. Thoracic
aorta is normal in caliber.

Mediastinum/Nodes: No enlarged lymph nodes within limitation of
noncontrast study. Esophagus is unremarkable. Thyroid is normal.

Lungs/Pleura: No consolidation or mass. Abnormalities on the prior
study have resolved. No pleural effusion.

Upper Abdomen: No acute abnormality.

Musculoskeletal: No acute osseous abnormality.
IMPRESSION: Resolution of abnormalities on the prior study.  No new findings.

## 2022-11-30 DIAGNOSIS — H00025 Hordeolum internum left lower eyelid: Secondary | ICD-10-CM | POA: Diagnosis not present

## 2023-03-04 ENCOUNTER — Encounter: Payer: Self-pay | Admitting: Cardiology

## 2023-03-04 ENCOUNTER — Ambulatory Visit: Payer: Managed Care, Other (non HMO) | Attending: Cardiology | Admitting: Cardiology

## 2023-03-04 VITALS — BP 122/78 | HR 70 | Resp 16 | Ht 74.0 in | Wt 238.4 lb

## 2023-03-04 DIAGNOSIS — E7800 Pure hypercholesterolemia, unspecified: Secondary | ICD-10-CM | POA: Diagnosis not present

## 2023-03-04 DIAGNOSIS — R0609 Other forms of dyspnea: Secondary | ICD-10-CM

## 2023-03-04 NOTE — Patient Instructions (Signed)
 Medication Instructions:  Your physician recommends that you continue on your current medications as directed. Please refer to the Current Medication list given to you today.  *If you need a refill on your cardiac medications before your next appointment, please call your pharmacy*   Lab Work: none If you have labs (blood work) drawn today and your tests are completely normal, you will receive your results only by: MyChart Message (if you have MyChart) OR A paper copy in the mail If you have any lab test that is abnormal or we need to change your treatment, we will call you to review the results.   Testing/Procedures: Your physician has requested that you have an exercise tolerance test. For further information please visit [email or web address] Please also follow instruction sheet, as given.    Follow-Up: At [facility], you and your health needs are our priority.  As part of our continuing mission to provide you with exceptional heart care, we have created designated Provider Care Teams.  These Care Teams include your primary Cardiologist (physician) and Advanced Practice Providers (APPs -  Physician Assistants and Nurse Practitioners) who all work together to provide you with the care you need, when you need it.  We recommend signing up for the patient portal called "MyChart".  Sign up information is provided on this After Visit Summary.  MyChart is used to connect with patients for Virtual Visits (Telemedicine).  Patients are able to view lab/test results, encounter notes, upcoming appointments, etc.  Non-urgent messages can be sent to your provider as well.   To learn more about what you can do with MyChart, go to [email or web address].    Your next appointment:   As needed  Provider:   Yates Decamp, MD     Other Instructions

## 2023-03-04 NOTE — Progress Notes (Signed)
Cardiology Office Note:  .   Date:  03/04/2023  ID:  Cory Bell, DOB [birth year], MRN [id number] PCP: Charlane Ferretti, DO  [facility] Providers Cardiologist:  Yates Decamp, MD   History of Present Illness: .   Cory Bell is a 43 y.o. Patient with hypogonadism, non-smoker, on CPAP, referred to me for evaluation of palpitations and dyspnea on exertion.  Patient has had COVID and COVID-pneumonia in 2021  Discussed the use of AI scribe software for clinical note transcription with the patient, who gave verbal consent to proceed.  History of Present Illness   The patient presents with shortness of breath for evaluation. He was referred by Dr. Jerelyn Scott for evaluation of his cardiovascular health.  He has experienced shortness of breath over the past year, occurring even while sitting or with minimal exertion, such as walking up a flight of stairs. He describes the sensation as 'feeling like I can't get my breath' and notes that his girlfriend has observed him breathing hard during these episodes. He is concerned about potential heart issues, especially since he plans to engage in strenuous walking activities soon. No chest pain during exertion or at rest.  He uses a CPAP machine for sleep apnea, which he finds effective, but notes that his sleep quality is poor without it. He has not had the CPAP machine checked in four to five years.  He engages in regular weight training exercises but admits to not doing much cardio. He recalls an incident last year where he experienced significant shortness of breath while swimming, which he attributes to being out of shape and heavier than in the past. He has attempted to improve his cardio fitness in the past, achieving a seven-minute mile, but found it very challenging.  He mentions concerns about his cholesterol levels, which he believes are high, and is worried about potential blockages. He has started to make dietary changes, such as  reducing carbohydrate intake, and acknowledges that his girlfriend is more health-conscious than he is.      Labs   External Labs:  PCP labs 02/15/2023:  Hb 17.7/HCT 52.0, platelets 203, normal indicis.  Total cholesterol 195, triglycerides 85, HDL 43, LDL 135.Marland Kitchen  NHDLC 152.  A1c 5.1%.  TSH normal at 1.8.  Serum creatinine 1.310, potassium 5.2, creatinine clearance 84.5, EGFR 73 mL.  Review of Systems  Cardiovascular:  Negative for chest pain, dyspnea on exertion and leg swelling.   Physical Exam:   VS:  BP 122/78 (BP Location: Left Arm, Patient Position: Sitting, Cuff Size: Large)   Pulse 70   Resp 16   Ht 6\' 2"  (1.88 m)   Wt 238 lb 6.4 oz (108.1 kg)   SpO2 95%   BMI 30.61 kg/m    Wt Readings from Last 3 Encounters:  03/04/23 238 lb 6.4 oz (108.1 kg)  07/25/20 234 lb 9.6 oz (106.4 kg)  01/23/20 245 lb (111.1 kg)    Physical Exam Neck:     Vascular: No carotid bruit or JVD.  Cardiovascular:     Rate and Rhythm: Normal rate and regular rhythm.     Pulses: Intact distal pulses.     Heart sounds: Normal heart sounds. No murmur heard.    No gallop.  Pulmonary:     Effort: Pulmonary effort is normal.     Breath sounds: Normal breath sounds.  Abdominal:     General: Bowel sounds are normal.     Palpations: Abdomen is soft.  Musculoskeletal:  Right lower leg: No edema.     Left lower leg: No edema.    Studies Reviewed: .    CT Chest 08/09/2020: Cardiovascular: Normal heart size. No pericardial effusion. Thoracic aorta is normal in caliber. Lungs/Pleura: No consolidation or mass. Abnormalities on the prior study have resolved. No pleural effusion.  EKG:    EKG Interpretation Date/Time:  Monday March 04 2023 11:24:47 EST Ventricular Rate:  69 PR Interval:  134 QRS Duration:  92 QT Interval:  376 QTC Calculation: 402 R Axis:   11  Text Interpretation: EKG 03/04/2023: Normal sinus rhythm at rate of 69 bpm, normal axis.  No evidence of ischemia, normal EKG.   Isolated T wave inversion in lead III, normal. Confirmed by Delrae Rend [id number]) on 03/04/2023 12:01:46 PM    Medications and allergies    Allergies  Allergen Reactions   Septra [Sulfamethoxazole-Trimethoprim] Rash   No current outpatient medications on file.   ASSESSMENT AND PLAN: .      ICD-10-CM   1. Dyspnea on exertion  R06.09 EKG 12-Lead    Informed Consent Details: Physician/Practitioner Attestation; Transcribe to consent form and obtain patient signature    EXERCISE TOLERANCE TEST (ETT)    2. Mild hypercholesterolemia  E78.00      Assessment and Plan    Shortness of Breath Intermittent dyspnea over the past year, both at rest and with minimal exertion. No associated chest pain. Likely due to deconditioning. No ischemia on recent EKG. Physical exam normal. Discussed importance of regular cardiovascular exercise and weight loss. Explained treadmill stress test to ensure safety during exertion. If normal, no further immediate interventions needed. - Order treadmill stress test - Recommend at least 15 minutes of cardiovascular exercise daily - Advise weight loss to reduce waist size to 34 inches - Encourage dietary changes to include more greens, vegetables, and less red meat  Hyperlipidemia Elevated cholesterol with LDL at 135 mg/dL and HDL at 43 mg/dL. No immediate need for medication. Emphasis on lifestyle modifications for primary prevention of heart disease. If LDL remains above 100 mg/dL after one year, medication may be considered. Explained that diet and exercise can significantly reduce LDL levels. - Recheck cholesterol in one year - Advise dietary modifications to lower LDL levels - Encourage weight loss and regular cardiovascular exercise  Obstructive Sleep Apnea Obstructive sleep apnea managed with CPAP. Reports poor sleep quality without CPAP. Last CPAP check was 4-5 years ago. Discussed importance of regular CPAP machine checks for optimal function and improved  sleep quality. - Recommend follow-up for CPAP machine check  General Health Maintenance Emphasized primary prevention and lifestyle modifications for overall health. Discussed regular cardiovascular exercise, healthy eating habits, and weight loss to reduce waist size to 34 inches. - Advise regular cardiovascular exercise - Encourage healthy eating habits - Recommend weight loss to reduce waist size to 34 inches  Follow-up - Schedule treadmill stress test - I will personally perform the test and if I find abnormalities,  will perform further evaluation. Otherwise unless new on ongoing symptoms(patient advised to contact us), preventive  therapy is recommended. I will then see the patient on a PRN basis.  - Recheck cholesterol in one year and if LDL > 100 start Rx with goal <100.Leonides Schanz,  Yates Decamp, MD, [facility] 03/04/2023, 12:50 PM [facility] [address] [place], [place] [address] Phone: [phone]. Fax:  [phone]

## 2023-03-07 ENCOUNTER — Ambulatory Visit: Payer: Managed Care, Other (non HMO) | Attending: Cardiology

## 2023-03-07 DIAGNOSIS — R0609 Other forms of dyspnea: Secondary | ICD-10-CM | POA: Diagnosis not present

## 2023-03-07 LAB — EXERCISE TOLERANCE TEST
Angina Index: 0
Duke Treadmill Score: 12
Estimated workload: 13.4
Exercise duration (min): 11 min
Exercise duration (sec): 30 s
MPHR: 177 {beats}/min
Peak HR: 164 {beats}/min
Percent HR: 92 %
RPE: 17
Rest HR: 60 {beats}/min
ST Depression (mm): 0 mm

## 2023-03-08 ENCOUNTER — Encounter: Payer: Self-pay | Admitting: Cardiology

## 2023-03-08 NOTE — Progress Notes (Signed)
 NOrmal GXT. Messaged patient

## 2023-04-23 ENCOUNTER — Ambulatory Visit: Payer: Managed Care, Other (non HMO) | Admitting: Cardiology
